# Patient Record
Sex: Male | Born: 1955 | Race: White | Hispanic: No | Marital: Single | Smoking: Current every day smoker
Health system: Southern US, Community
[De-identification: ages and names within clinical notes are randomized; demographics above are authoritative.]

## PROBLEM LIST (undated history)

## (undated) DIAGNOSIS — J449 Chronic obstructive pulmonary disease, unspecified: Secondary | ICD-10-CM

## (undated) DIAGNOSIS — M1990 Unspecified osteoarthritis, unspecified site: Secondary | ICD-10-CM

## (undated) DIAGNOSIS — M109 Gout, unspecified: Secondary | ICD-10-CM

## (undated) DIAGNOSIS — C801 Malignant (primary) neoplasm, unspecified: Secondary | ICD-10-CM

## (undated) HISTORY — PX: COLONOSCOPY: SHX174

---

## 1988-12-24 HISTORY — PX: KNEE SURGERY: SHX244

## 2012-07-29 ENCOUNTER — Ambulatory Visit: Payer: Self-pay | Admitting: Internal Medicine

## 2012-07-29 ENCOUNTER — Ambulatory Visit: Payer: Self-pay

## 2012-07-29 VITALS — BP 131/69 | HR 79 | Temp 97.6°F | Resp 16 | Ht 70.0 in | Wt 168.0 lb

## 2012-07-29 DIAGNOSIS — M25569 Pain in unspecified knee: Secondary | ICD-10-CM

## 2012-07-29 DIAGNOSIS — M25562 Pain in left knee: Secondary | ICD-10-CM

## 2012-07-29 DIAGNOSIS — M109 Gout, unspecified: Secondary | ICD-10-CM

## 2012-07-29 LAB — URIC ACID: Uric Acid, Serum: 7.2 mg/dL (ref 4.0–7.8)

## 2012-07-29 LAB — COMPREHENSIVE METABOLIC PANEL
ALT: 21 U/L (ref 0–53)
CO2: 27 mEq/L (ref 19–32)
Calcium: 9.1 mg/dL (ref 8.4–10.5)
Chloride: 102 mEq/L (ref 96–112)
Creat: 0.72 mg/dL (ref 0.50–1.35)
Glucose, Bld: 115 mg/dL — ABNORMAL HIGH (ref 70–99)
Sodium: 139 mEq/L (ref 135–145)
Total Protein: 6.8 g/dL (ref 6.0–8.3)

## 2012-07-29 LAB — POCT CBC
HCT, POC: 55 % — AB (ref 43.5–53.7)
Hemoglobin: 17.7 g/dL (ref 14.1–18.1)
MCH, POC: 33.8 pg — AB (ref 27–31.2)
MPV: 7.7 fL (ref 0–99.8)
POC MID %: 5.3 %M (ref 0–12)
RBC: 5.23 M/uL (ref 4.69–6.13)
WBC: 9.1 10*3/uL (ref 4.6–10.2)

## 2012-07-29 MED ORDER — INDOMETHACIN 50 MG PO CAPS: 50.0000 mg | ORAL_CAPSULE | Freq: Three times a day (TID) | ORAL | Status: AC

## 2012-07-29 MED ORDER — HYDROCODONE-ACETAMINOPHEN 5-500 MG PO TABS: 1.0000 | ORAL_TABLET | Freq: Three times a day (TID) | ORAL | Status: AC | PRN

## 2012-07-29 MED ORDER — METHYLPREDNISOLONE ACETATE 80 MG/ML IJ SUSP: 80.0000 mg | Freq: Once | INTRAMUSCULAR | Status: DC

## 2012-07-29 NOTE — Progress Notes (Signed)
  Subjective:    Patient ID: Edwin Hudson, male    DOB: [birth year], 56 y.o.   MRN: [id number]  HPI Has gout attack with swollen left knee. Has orthopedist that drains it but not available today. Needs knee aspirated and meds for gout attack. No fever, nothing out of the ordinary.    Review of Systems No oyher issues    Objective:   Physical Exam Left knee swollen, mildly warm, red, very painful COR ok  Aspiration knee 80cc yellow, mild cloudy fluid C and S joint fluid done Results for orders placed in visit on 07/29/12  POCT CBC      Component Value Range   WBC 9.1  4.6 - 10.2 K/uL   Lymph, poc 1.0  0.6 - 3.4   POC LYMPH PERCENT 10.6  10 - 50 %L   MID (cbc) 0.5  0 - 0.9   POC MID % 5.3  0 - 12 %M   POC Granulocyte 7.7 (*) 2 - 6.9   Granulocyte percent 84.1 (*) 37 - 80 %G   RBC 5.23  4.69 - 6.13 M/uL   Hemoglobin 17.7  14.1 - 18.1 g/dL   HCT, POC 40.9 (*) 81.1 - 53.7 %   MCV 105.2 (*) 80 - 97 fL   MCH, POC 33.8 (*) 27 - 31.2 pg   MCHC 32.2  31.8 - 35.4 g/dL   RDW, POC 91.4     Platelet Count, POC 210  142 - 424 K/uL   MPV 7.7  0 - 99.8 fL  Depomedrol 80mg  injected  [facility] reading (PRIMARY) by  Dr.Maccoy Haubner has acute knee effusion with probable gout, obvious DJD knee jt.       Assessment & Plan:  Rest,ice,wound care HC and indocin given

## 2016-02-28 ENCOUNTER — Other Ambulatory Visit: Payer: Self-pay

## 2016-02-28 DIAGNOSIS — M79609 Pain in unspecified limb: Secondary | ICD-10-CM

## 2016-02-28 DIAGNOSIS — R0989 Other specified symptoms and signs involving the circulatory and respiratory systems: Secondary | ICD-10-CM

## 2016-03-23 ENCOUNTER — Encounter: Payer: Self-pay | Admitting: Vascular Surgery

## 2016-03-30 ENCOUNTER — Ambulatory Visit (INDEPENDENT_AMBULATORY_CARE_PROVIDER_SITE_OTHER): Payer: Medicaid Other | Admitting: Vascular Surgery

## 2016-03-30 ENCOUNTER — Encounter: Payer: Self-pay | Admitting: Vascular Surgery

## 2016-03-30 ENCOUNTER — Ambulatory Visit (HOSPITAL_COMMUNITY)
Admission: RE | Admit: 2016-03-30 | Discharge: 2016-03-30 | Disposition: A | Discharge status: Home / Self Care | Payer: Medicaid Other | Source: Ambulatory Visit | Attending: Vascular Surgery | Admitting: Vascular Surgery

## 2016-03-30 VITALS — BP 115/73 | HR 58 | Ht 70.0 in | Wt 151.8 lb

## 2016-03-30 DIAGNOSIS — M79609 Pain in unspecified limb: Secondary | ICD-10-CM | POA: Insufficient documentation

## 2016-03-30 DIAGNOSIS — R0989 Other specified symptoms and signs involving the circulatory and respiratory systems: Secondary | ICD-10-CM

## 2016-03-30 DIAGNOSIS — I739 Peripheral vascular disease, unspecified: Secondary | ICD-10-CM | POA: Diagnosis not present

## 2016-03-30 MED ORDER — VARENICLINE TARTRATE 0.5 MG PO TABS: 0.5000 mg | ORAL_TABLET | Freq: Two times a day (BID) | ORAL | Status: DC

## 2016-03-30 NOTE — Progress Notes (Signed)
Referring Physician: Joni Fears M.D. Patient name: Edwin Hudson MRN: [id number] DOB: Dec 21, 1956 Sex: male  REASON FOR CONSULT: Abnormal pulse exam left foot  HPI: Edwin Hudson is a 60 y.o. male referred by Dr. Durward Fortes for possible peripheral arterial disease. The patient is being considered for a left total knee replacement. He was noted to have an abnormal pulse exam on Dr. Rudene Anda initial evaluation. He denies any claudication symptoms. He has no history of nonhealing wounds. He has no rest pain. He has a 1 pack per day smoker. He used to smoke 2 packs a day. Other medical problems include gout arthritis and remote history of bladder cancer. This is all stable. Greater than 3 minutes today spent regarding smoking cessation counseling.  Past medical history: Gout and arthritis and bladder cancer No past surgical history on file.  No family history on file.  SOCIAL HISTORY: Social History   Social History  . Marital Status: Single    Spouse Name: N/A  . Number of Children: N/A  . Years of Education: N/A   Occupational History  . Not on file.   Social History Main Topics  . Smoking status: Current Every Day Smoker -- 1.00 packs/day for 40 years    Types: Cigarettes  . Smokeless tobacco: Not on file  . Alcohol Use: 0.0 oz/week    0 Standard drinks or equivalent per week  . Drug Use: No  . Sexual Activity: Not on file   Other Topics Concern  . Not on file   Social History Narrative    No Known Allergies  Current Outpatient Prescriptions  Medication Sig Dispense Refill  . indomethacin (INDOCIN SR) 75 MG CR capsule Take 75 mg by mouth daily with breakfast. Reported on 03/30/2016    . varenicline (CHANTIX) 0.5 MG tablet Take 1 tablet (0.5 mg total) by mouth 2 (two) times daily. 60 tablet 2   Current Facility-Administered Medications  Medication Dose Route Frequency Provider Last Rate Last Dose  . methylPREDNISolone acetate (DEPO-MEDROL) injection 80 mg   80 mg Intramuscular Once Orma Flaming, MD        ROS:   General:  No weight loss, Fever, chills  HEENT: No recent headaches, no nasal bleeding, no visual changes, no sore throat  Neurologic: No dizziness, blackouts, seizures. No recent symptoms of stroke or mini- stroke. No recent episodes of slurred speech, or temporary blindness.  Cardiac: No recent episodes of chest pain/pressure, no shortness of breath at rest.  No shortness of breath with exertion.  Denies history of atrial fibrillation or irregular heartbeat  Vascular: No history of rest pain in feet.  No history of claudication.  No history of non-healing ulcer, No history of DVT   Pulmonary: No home oxygen, no productive cough, no hemoptysis,  No asthma or wheezing  Musculoskeletal:  [x ] Arthritis, [ ]  Low back pain,  [x ] Joint pain  Hematologic:No history of hypercoagulable state.  No history of easy bleeding.  No history of anemia  Gastrointestinal: No hematochezia or melena,  No gastroesophageal reflux, no trouble swallowing  Urinary: [ ]  chronic Kidney disease, [ ]  on HD - [ ]  MWF or [ ]  TTHS, [ ]  Burning with urination, [ ]  Frequent urination, [ ]  Difficulty urinating;   Skin: No rashes  Psychological: No history of anxiety,  No history of depression   Physical Examination  Filed Vitals:   03/30/16 1012  BP: 115/73  Pulse: 58  Height: 5\' 10"  (1.778  m)  Weight: 151 lb 12.8 oz (68.856 kg)  SpO2: 100%    Body mass index is 21.78 kg/(m^2).  General:  Alert and oriented, no acute distress HEENT: Normal Neck: No bruit or JVD Pulmonary: Clear to auscultation bilaterally Cardiac: Regular Rate and Rhythm without murmur Abdomen: Soft, non-tender, non-distended, no mass Skin: No rash Extremity Pulses:  2+ radial, brachial, femoral, Absent bilateral dorsalis pedis, 2+ posterior tibial pulses bilaterally Musculoskeletal: Enlarged left knee and degenerative changes bilateral hands with some warmth and effusion in  the left knee no erythema  Neurologic: Upper and lower extremity motor 5/5 and symmetric  DATA:  Patient had bilateral ABIs performed today. These were greater than 1 triphasic bilaterally. His was a normal study.  ASSESSMENT:  Patient with risk factors of atherosclerotic occlusive disease but currently asymptomatic with normal arterial non-invasive exam and essentially normal physical exam. I discussed with the patient today smoking cessation. At this point he is willing to try Chantix. I did discuss with him that one of the possible side effects from Chantix would be some bizarre dreams or mood swings. He understands that if he experiences any of these he will stop the medication.  PLAN:  Prescription for Chantix 1 month supply with 2 refills. Patient will follow-up with me on an as-needed basis. He has adequate arterial supply to heal a left total knee replacement.   Ruta Hinds, MD [facility] Office: [phone] Pager: [phone]

## 2016-09-12 ENCOUNTER — Encounter (INDEPENDENT_AMBULATORY_CARE_PROVIDER_SITE_OTHER): Payer: Self-pay

## 2016-09-13 ENCOUNTER — Encounter (INDEPENDENT_AMBULATORY_CARE_PROVIDER_SITE_OTHER): Payer: Self-pay

## 2016-09-18 ENCOUNTER — Ambulatory Visit (HOSPITAL_COMMUNITY)
Admission: RE | Admit: 2016-09-18 | Discharge: 2016-09-18 | Disposition: A | Discharge status: Home / Self Care | Payer: Medicaid Other | Source: Ambulatory Visit | Attending: Orthopedic Surgery | Admitting: Orthopedic Surgery

## 2016-09-18 ENCOUNTER — Encounter (HOSPITAL_COMMUNITY)
Admission: RE | Admit: 2016-09-18 | Discharge: 2016-09-18 | Disposition: A | Discharge status: Home / Self Care | Payer: Medicaid Other | Source: Ambulatory Visit | Attending: Orthopaedic Surgery | Admitting: Orthopaedic Surgery

## 2016-09-18 ENCOUNTER — Encounter (HOSPITAL_COMMUNITY): Payer: Self-pay

## 2016-09-18 DIAGNOSIS — Z01818 Encounter for other preprocedural examination: Secondary | ICD-10-CM

## 2016-09-18 DIAGNOSIS — R918 Other nonspecific abnormal finding of lung field: Secondary | ICD-10-CM | POA: Insufficient documentation

## 2016-09-18 DIAGNOSIS — R001 Bradycardia, unspecified: Secondary | ICD-10-CM | POA: Diagnosis not present

## 2016-09-18 HISTORY — DX: Gout, unspecified: M10.9

## 2016-09-18 HISTORY — DX: Malignant (primary) neoplasm, unspecified: C80.1

## 2016-09-18 LAB — URINALYSIS, ROUTINE W REFLEX MICROSCOPIC
Bilirubin Urine: NEGATIVE
GLUCOSE, UA: NEGATIVE mg/dL
HGB URINE DIPSTICK: NEGATIVE
KETONES UR: NEGATIVE mg/dL
LEUKOCYTES UA: NEGATIVE
Nitrite: NEGATIVE
PROTEIN: NEGATIVE mg/dL
Specific Gravity, Urine: 1.012 (ref 1.005–1.030)
pH: 6.5 (ref 5.0–8.0)

## 2016-09-18 LAB — COMPREHENSIVE METABOLIC PANEL
ALBUMIN: 4.2 g/dL (ref 3.5–5.0)
ALT: 11 U/L — ABNORMAL LOW (ref 17–63)
ANION GAP: 7 (ref 5–15)
AST: 17 U/L (ref 15–41)
Alkaline Phosphatase: 66 U/L (ref 38–126)
BUN: 6 mg/dL (ref 6–20)
CALCIUM: 9.5 mg/dL (ref 8.9–10.3)
CO2: 27 mmol/L (ref 22–32)
Chloride: 104 mmol/L (ref 101–111)
Creatinine, Ser: 0.75 mg/dL (ref 0.61–1.24)
GFR calc non Af Amer: >60 mL/min (ref >60)
GLUCOSE: 114 mg/dL — AB (ref 65–99)
POTASSIUM: 4 mmol/L (ref 3.5–5.1)
SODIUM: 138 mmol/L (ref 135–145)
TOTAL PROTEIN: 7.2 g/dL (ref 6.5–8.1)
Total Bilirubin: 0.6 mg/dL (ref 0.3–1.2)

## 2016-09-18 LAB — TYPE AND SCREEN
ABO/RH(D): A POS
Antibody Screen: NEGATIVE

## 2016-09-18 LAB — CBC WITH DIFFERENTIAL/PLATELET
BASOS PCT: 1 %
Basophils Absolute: 0 10*3/uL (ref 0.0–0.1)
EOS ABS: 0.1 10*3/uL (ref 0.0–0.7)
EOS PCT: 1 %
HCT: 46.7 % (ref 39.0–52.0)
Hemoglobin: 15.7 g/dL (ref 13.0–17.0)
LYMPHS ABS: 1.3 10*3/uL (ref 0.7–4.0)
Lymphocytes Relative: 16 %
MCH: 34.6 pg — AB (ref 26.0–34.0)
MCHC: 33.6 g/dL (ref 30.0–36.0)
MCV: 102.9 fL — ABNORMAL HIGH (ref 78.0–100.0)
MONO ABS: 0.4 10*3/uL (ref 0.1–1.0)
MONOS PCT: 5 %
NEUTROS PCT: 77 %
Neutro Abs: 6.6 10*3/uL (ref 1.7–7.7)
PLATELETS: 211 10*3/uL (ref 150–400)
RBC: 4.54 MIL/uL (ref 4.22–5.81)
RDW: 14.4 % (ref 11.5–15.5)
WBC: 8.5 10*3/uL (ref 4.0–10.5)

## 2016-09-18 LAB — APTT: aPTT: 35 seconds (ref 24–36)

## 2016-09-18 LAB — PROTIME-INR
INR: 1.01
Prothrombin Time: 13.3 seconds (ref 11.4–15.2)

## 2016-09-18 LAB — ABO/RH: ABO/RH(D): A POS

## 2016-09-18 LAB — SURGICAL PCR SCREEN
MRSA, PCR: NEGATIVE
Staphylococcus aureus: NEGATIVE

## 2016-09-18 NOTE — Pre-Procedure Instructions (Addendum)
IMAD KRIS  09/18/2016      [id number] - [place], [place] - [address] [place] [address] Phone: [phone] Fax: [phone]  [place], [place] [address] Phone: [phone] Fax: [phone]    Your procedure is scheduled on October 3  Report to [facility] at [place].M.  Call this number if you have problems the morning of surgery:  [phone]   Remember:  Do not eat food or drink liquids after midnight.   Take these medicines the morning of surgery with A SIP OF WATER NONE  7 days prior to surgery STOP taking any Aspirin, Aleve, Naproxen, Ibuprofen, Motrin, Advil, Goody's, BC's, all herbal medications, fish oil, and all vitamins   Do not wear jewelry.  Do not wear lotions, powders, or cologne, or deoderant.  Men may shave face and neck.  Do not bring valuables to the hospital.  [facility] is not responsible for any belongings or valuables.  Contacts, dentures or bridgework may not be worn into surgery.  Leave your suitcase in the car.  After surgery it may be brought to your room.  For patients admitted to the hospital, discharge time will be determined by your treatment team.  Patients discharged the day of surgery will not be allowed to drive home.    Special instructions:   [facility]- Preparing For Surgery  Before surgery, you can play an important role. Because skin is not sterile, your skin needs to be as free of germs as possible. You can reduce the number of germs on your skin by washing with CHG (chlorahexidine gluconate) Soap before surgery.  CHG is an antiseptic cleaner which kills germs and bonds with the skin to continue killing germs even after washing.  Please do not use if you have an allergy to CHG or antibacterial soaps. If your skin becomes reddened/irritated stop using the CHG.  Do not shave (including legs and  underarms) for at least 48 hours prior to first CHG shower. It is OK to shave your face.  Please follow these instructions carefully.   1. Shower the NIGHT BEFORE SURGERY and the MORNING OF SURGERY with CHG.   2. If you chose to wash your hair, wash your hair first as usual with your normal shampoo.  3. After you shampoo, rinse your hair and body thoroughly to remove the shampoo.  4. Use CHG as you would any other liquid soap. You can apply CHG directly to the skin and wash gently with a scrungie or a clean washcloth.   5. Apply the CHG Soap to your body ONLY FROM THE NECK DOWN.  Do not use on open wounds or open sores. Avoid contact with your eyes, ears, mouth and genitals (private parts). Wash genitals (private parts) with your normal soap.  6. Wash thoroughly, paying special attention to the area where your surgery will be performed.  7. Thoroughly rinse your body with warm water from the neck down.  8. DO NOT shower/wash with your normal soap after using and rinsing off the CHG Soap.  9. Pat yourself dry with a CLEAN TOWEL.   10. Wear CLEAN PAJAMAS   11. Place CLEAN SHEETS on your bed the night of your first shower and DO NOT SLEEP WITH PETS.    Day of Surgery: Do not apply any deodorants/lotions. Please wear clean clothes to the hospital/surgery center.  Please read over the following fact sheets that you were given. Pain Booklet, Coughing and Deep Breathing, MRSA Information and Surgical Site Infection Prevention

## 2016-09-18 NOTE — Progress Notes (Signed)
PCP - Katheran Awe, PA-c Cardiologist - Revankar  Chest x-ray - 09/18/16 EKG - 09/18/16 Stress Test - 02/18/16 ECHO - 02/18/16 Cardiac Cath - denies  In home sleep study PCP send patient requesting records    Patient denies shortness of breath, fever, cough and chest pain at PAT appointment

## 2016-09-18 NOTE — H&P (Addendum)
CHIEF COMPLAINT:  Painful left knee.   HISTORY OF PRESENT ILLNESS:  Edwin Hudson is a very pleasant, 60 year old, white male who is seen today for evaluation of his left knee.  I initially saw him back in February 2017 and at that time he had at least a 54-month history of pain in the left knee with a feeling almost as if it wanted to collapse on him.  However, on January 07, 2016, he was in the yard and felt as if it was going to collapse and did have a problem where he started having pain and discomfort and was unable to fully extend the knee.  This was an acute onset.  It was not really traumatic at all.  He was just walking in the yard.  He had at that time a dull aching pain, but he was finally able to extend the knee.  He did have an arthroscopy back in 2008 by [facility] for the left knee and we are not really sure exactly what was done, but at least a meniscectomy.  He did have problems with weightbearing and could not extend the knee at that time.  A corticosteroid injection was instilled at that time.  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  He followed back and he was losing more motion in March.  However, he really did not follow back up anymore for his knee, but then noted his symptoms were worsening.  We had already talked about a total knee replacement as a definitive procedure.  He continued to have pain and discomfort and finally decided to consider re-evaluation and consideration for a left total knee arthroplasty.  He has tried anti-inflammatories, corticosteroid injection and he has tried a home exercise program and these have failed.  He has pain pretty much now with every step and nighttime pain.  He is limited with his activities of daily living secondary to this pain.  He is seen today for evaluation.   PAST MEDICAL HISTORY:  In general, his health is good.   PAST SURGICAL  HISTORY/HOSPITALIZATIONS:  In 2008, a left knee arthroscopic debridement; 2010 bladder cancer excised by transurethral resection; 1990 for right knee lateral posterior meniscectomy.   MEDICATIONS:  Colchicine 0.6 mg p.r.n. for gout.   ALLERGIES:  NONE KNOWN.   REVIEW OF SYSTEMS:  A 14-point review of systems is positive for tinnitus in both ears for the past year.  He states it is slight.  He has upper and lower dentures.  He does have a morning cough, consistent with the cigarette smoking that he does. He does have a history of gout and is treated with colchicine.  He has nocturia x1. He has lost 20 pounds over the past year because of appetite loss.   FAMILY HISTORY:  Positive for a mother who is 61 years of age and has Alzheimer's.  Father is 29 and healthy.  One brother is 31 years old and healthy.  He has 2 sisters, aged 81 and 39 and are healthy.   SOCIAL HISTORY:  Edwin Hudson is a 60 year old, white, divorced, male.  A carpenter.  He smokes 2 packs of cigarettes per day and smoked for 20 years.  He is down to less than a pack, that of about 15 cigarettes per day.  He drinks alcohol on the weekends and about 2 drinks a day.   PHYSICAL EXAMINATION:   General:  A 60 year old, white male.  Well developed, well nourished, alert, cooperative and in moderate distress secondary to left knee pain.  Height 5 feet 8 inches.  Weight 150 pounds.  BMI is 22.8.  Head is normocephalic.  Eyes:  Pupils equal, round and reactive to light and accommodation with extraocular movements are intact.  Ears, nose and throat were benign. Neck was supple.  No carotid bruits noted. Chest had good expansion. Lungs were essentially clear.  There is decreased breath sounds and an occasional wheeze. Cardiac:  He had a regular rhythm and rate.  Distant heart sounds.  Normal S1, S2.  No murmurs appreciated.  Pulses were 2+ bilaterally and symmetric in the lower extremities. Abdomen is scaphoid, soft and nontender.  No masses  palpable.  Normal bowel sounds present. Genital, rectal and breast exam not indicated for an orthopedic evaluation. Musculoskeletal:  He has range of motion that lacks about 8 to 10 degrees and flexes to about 115.  He does have a mild effusion.  No warmth or erythema.  He is neurovascularly intact distally.   RADIOGRAPHS:  X-rays reveal what appears to be avascular necrosis of the distal femur with marked sclerosis and modeling noted throughout the femur and proximal tibia.  Cystic changes in the intercondylar notch also noted.  He has marked patellofemoral arthritis and joint space narrowing with periarticular spurring.  Probable loose body posteriorly.  He does have marked OA in the lateral position of the left patella with bone-on-bone.   CLINICAL IMPRESSION:   1.  End-stage OA, left knee. 2.  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 3.  Cigarette smoker. 4.  History of bladder cancer with excision. 5.  History of gout.   RECOMMENDATIONS:  At this time he has had medical clearance noted.  Cardiac also had clearance, according to documentation I have reviewed today.  Vascular has also evaluated the patient and felt that he does have bilateral ABIs that were greater than 1 triphasic bilaterally, which was basically a normal study.  At this time we will proceed with a left total knee arthroplasty.  Procedure, risks and benefits were fully explained to him and he is understanding.  I have used appropriate models to explain everything to him.  All questions were answered in detail   Mike Craze. [place], [place] [phone]  09/25/2016 7:09 AM

## 2016-09-19 LAB — URINE CULTURE: Culture: NO GROWTH

## 2016-09-24 MED ORDER — TRANEXAMIC ACID 1000 MG/10ML IV SOLN
2000.0000 mg | INTRAVENOUS | Status: DC
  Filled 2016-09-24: qty 20

## 2016-09-25 ENCOUNTER — Inpatient Hospital Stay (HOSPITAL_COMMUNITY)
Admission: RE | Admit: 2016-09-25 | Discharge: 2016-09-27 | DRG: 470 | Disposition: A | Discharge status: Home / Self Care | Payer: Medicaid Other | Source: Ambulatory Visit | Attending: Orthopaedic Surgery | Admitting: Orthopaedic Surgery

## 2016-09-25 ENCOUNTER — Inpatient Hospital Stay (HOSPITAL_COMMUNITY): Payer: Medicaid Other | Admitting: Anesthesiology

## 2016-09-25 ENCOUNTER — Encounter (HOSPITAL_COMMUNITY)
Admission: RE | Disposition: A | Discharge status: Home / Self Care | Payer: Self-pay | Source: Ambulatory Visit | Attending: Orthopaedic Surgery

## 2016-09-25 ENCOUNTER — Encounter (HOSPITAL_COMMUNITY): Payer: Self-pay | Admitting: *Deleted

## 2016-09-25 DIAGNOSIS — M109 Gout, unspecified: Secondary | ICD-10-CM | POA: Diagnosis present

## 2016-09-25 DIAGNOSIS — M1712 Unilateral primary osteoarthritis, left knee: Secondary | ICD-10-CM | POA: Diagnosis not present

## 2016-09-25 DIAGNOSIS — Z9889 Other specified postprocedural states: Secondary | ICD-10-CM | POA: Diagnosis present

## 2016-09-25 DIAGNOSIS — Z906 Acquired absence of other parts of urinary tract: Secondary | ICD-10-CM | POA: Diagnosis not present

## 2016-09-25 DIAGNOSIS — Z820 Family history of epilepsy and other diseases of the nervous system: Secondary | ICD-10-CM | POA: Diagnosis not present

## 2016-09-25 DIAGNOSIS — Z96652 Presence of left artificial knee joint: Secondary | ICD-10-CM

## 2016-09-25 DIAGNOSIS — Z8551 Personal history of malignant neoplasm of bladder: Secondary | ICD-10-CM | POA: Diagnosis not present

## 2016-09-25 DIAGNOSIS — M659 Synovitis and tenosynovitis, unspecified: Secondary | ICD-10-CM | POA: Diagnosis present

## 2016-09-25 DIAGNOSIS — F17210 Nicotine dependence, cigarettes, uncomplicated: Secondary | ICD-10-CM | POA: Diagnosis present

## 2016-09-25 HISTORY — PX: TOTAL KNEE ARTHROPLASTY: SHX125

## 2016-09-25 SURGERY — ARTHROPLASTY, KNEE, TOTAL
Anesthesia: Monitor Anesthesia Care | Site: Knee | Laterality: Left

## 2016-09-25 MED ORDER — CEFAZOLIN SODIUM-DEXTROSE 2-4 GM/100ML-% IV SOLN
2.0000 g | INTRAVENOUS | Status: AC
  Administered 2016-09-25: 2 g via INTRAVENOUS
  Filled 2016-09-25: qty 100

## 2016-09-25 MED ORDER — FENTANYL CITRATE (PF) 100 MCG/2ML IJ SOLN
INTRAMUSCULAR | Status: DC | PRN
  Administered 2016-09-25: 25 ug via INTRAVENOUS
  Administered 2016-09-25 (×2): 50 ug via INTRAVENOUS
  Administered 2016-09-25 (×3): 25 ug via INTRAVENOUS
  Administered 2016-09-25: 50 ug via INTRAVENOUS
  Administered 2016-09-25 (×2): 25 ug via INTRAVENOUS

## 2016-09-25 MED ORDER — VITAMIN B-12 1000 MCG PO TABS
2000.0000 ug | ORAL_TABLET | ORAL | Status: DC
  Administered 2016-09-27: 2000 ug via ORAL
  Filled 2016-09-25 (×2): qty 2

## 2016-09-25 MED ORDER — COLCHICINE 0.6 MG PO TABS: 0.6000 mg | ORAL_TABLET | Freq: Every day | ORAL | Status: DC | PRN

## 2016-09-25 MED ORDER — 0.9 % SODIUM CHLORIDE (POUR BTL) OPTIME
TOPICAL | Status: DC | PRN
  Administered 2016-09-25: 1000 mL

## 2016-09-25 MED ORDER — FENTANYL CITRATE (PF) 100 MCG/2ML IJ SOLN
25.0000 ug | INTRAMUSCULAR | Status: AC | PRN
  Administered 2016-09-25 (×6): 25 ug via INTRAVENOUS

## 2016-09-25 MED ORDER — EPHEDRINE 5 MG/ML INJ
INTRAVENOUS | Status: AC
  Filled 2016-09-25: qty 10

## 2016-09-25 MED ORDER — SODIUM CHLORIDE 0.9 % IV SOLN: INTRAVENOUS | Status: DC

## 2016-09-25 MED ORDER — EPHEDRINE SULFATE 50 MG/ML IJ SOLN
INTRAMUSCULAR | Status: DC | PRN
  Administered 2016-09-25: 5 mg via INTRAVENOUS
  Administered 2016-09-25 (×4): 10 mg via INTRAVENOUS
  Administered 2016-09-25: 5 mg via INTRAVENOUS

## 2016-09-25 MED ORDER — DIPHENHYDRAMINE HCL 12.5 MG/5ML PO ELIX: 12.5000 mg | ORAL_SOLUTION | ORAL | Status: DC | PRN

## 2016-09-25 MED ORDER — MIDAZOLAM HCL 2 MG/2ML IJ SOLN
1.0000 mg | Freq: Once | INTRAMUSCULAR | Status: AC
  Administered 2016-09-25: 1 mg via INTRAVENOUS

## 2016-09-25 MED ORDER — BUPIVACAINE HCL (PF) 0.25 % IJ SOLN
INTRAMUSCULAR | Status: AC
  Filled 2016-09-25: qty 30

## 2016-09-25 MED ORDER — FENTANYL CITRATE (PF) 100 MCG/2ML IJ SOLN
INTRAMUSCULAR | Status: AC
  Filled 2016-09-25: qty 2

## 2016-09-25 MED ORDER — HYDROMORPHONE HCL 1 MG/ML IJ SOLN
0.5000 mg | INTRAMUSCULAR | Status: DC | PRN
  Administered 2016-09-25 – 2016-09-26 (×2): 1 mg via INTRAVENOUS
  Filled 2016-09-25: qty 1

## 2016-09-25 MED ORDER — OXYCODONE HCL 5 MG/5ML PO SOLN: 5.0000 mg | Freq: Once | ORAL | Status: AC | PRN

## 2016-09-25 MED ORDER — TRANEXAMIC ACID 1000 MG/10ML IV SOLN
INTRAVENOUS | Status: DC | PRN
  Administered 2016-09-25: 2000 mg via TOPICAL

## 2016-09-25 MED ORDER — ACETAMINOPHEN 10 MG/ML IV SOLN
1000.0000 mg | Freq: Four times a day (QID) | INTRAVENOUS | Status: AC
  Administered 2016-09-25 – 2016-09-26 (×4): 1000 mg via INTRAVENOUS
  Filled 2016-09-25 (×4): qty 100

## 2016-09-25 MED ORDER — ONDANSETRON HCL 4 MG/2ML IJ SOLN
INTRAMUSCULAR | Status: AC
  Filled 2016-09-25: qty 2

## 2016-09-25 MED ORDER — MENTHOL 3 MG MT LOZG: 1.0000 | LOZENGE | OROMUCOSAL | Status: DC | PRN

## 2016-09-25 MED ORDER — METOCLOPRAMIDE HCL 5 MG PO TABS: 5.0000 mg | ORAL_TABLET | Freq: Three times a day (TID) | ORAL | Status: DC | PRN

## 2016-09-25 MED ORDER — METHOCARBAMOL 500 MG PO TABS
500.0000 mg | ORAL_TABLET | Freq: Four times a day (QID) | ORAL | Status: DC | PRN
  Administered 2016-09-25 – 2016-09-27 (×3): 500 mg via ORAL
  Filled 2016-09-25 (×3): qty 1

## 2016-09-25 MED ORDER — SODIUM CHLORIDE 0.9 % IR SOLN
Status: DC | PRN
  Administered 2016-09-25: 3000 mL

## 2016-09-25 MED ORDER — MAGNESIUM CITRATE PO SOLN: 1.0000 | Freq: Once | ORAL | Status: DC | PRN

## 2016-09-25 MED ORDER — HYDROMORPHONE HCL 1 MG/ML IJ SOLN
INTRAMUSCULAR | Status: AC
  Filled 2016-09-25: qty 1

## 2016-09-25 MED ORDER — ONDANSETRON HCL 4 MG/2ML IJ SOLN: 4.0000 mg | Freq: Once | INTRAMUSCULAR | Status: DC | PRN

## 2016-09-25 MED ORDER — CHLORHEXIDINE GLUCONATE 4 % EX LIQD: 60.0000 mL | Freq: Once | CUTANEOUS | Status: DC

## 2016-09-25 MED ORDER — LACTATED RINGERS IV SOLN
INTRAVENOUS | Status: DC | PRN
  Administered 2016-09-25 (×2): via INTRAVENOUS

## 2016-09-25 MED ORDER — ALUM & MAG HYDROXIDE-SIMETH 200-200-20 MG/5ML PO SUSP: 30.0000 mL | ORAL | Status: DC | PRN

## 2016-09-25 MED ORDER — RIVAROXABAN 10 MG PO TABS
10.0000 mg | ORAL_TABLET | Freq: Every day | ORAL | Status: DC
  Administered 2016-09-26 – 2016-09-27 (×2): 10 mg via ORAL
  Filled 2016-09-25 (×2): qty 1

## 2016-09-25 MED ORDER — METOCLOPRAMIDE HCL 5 MG/ML IJ SOLN: 5.0000 mg | Freq: Three times a day (TID) | INTRAMUSCULAR | Status: DC | PRN

## 2016-09-25 MED ORDER — POLYETHYLENE GLYCOL 3350 17 G PO PACK: 17.0000 g | PACK | Freq: Every day | ORAL | Status: DC | PRN

## 2016-09-25 MED ORDER — ONDANSETRON HCL 4 MG PO TABS: 4.0000 mg | ORAL_TABLET | Freq: Four times a day (QID) | ORAL | Status: DC | PRN

## 2016-09-25 MED ORDER — KETOROLAC TROMETHAMINE 15 MG/ML IJ SOLN
15.0000 mg | Freq: Four times a day (QID) | INTRAMUSCULAR | Status: AC
Start: 2016-09-25 — End: 2016-09-26
  Administered 2016-09-25 – 2016-09-26 (×3): 15 mg via INTRAVENOUS
  Filled 2016-09-25 (×3): qty 1

## 2016-09-25 MED ORDER — PHENOL 1.4 % MT LIQD: 1.0000 | OROMUCOSAL | Status: DC | PRN

## 2016-09-25 MED ORDER — ROCURONIUM BROMIDE 10 MG/ML (PF) SYRINGE
PREFILLED_SYRINGE | INTRAVENOUS | Status: AC
  Filled 2016-09-25: qty 10

## 2016-09-25 MED ORDER — MIDAZOLAM HCL 2 MG/2ML IJ SOLN
INTRAMUSCULAR | Status: AC
  Filled 2016-09-25: qty 2

## 2016-09-25 MED ORDER — ACETAMINOPHEN 10 MG/ML IV SOLN
1000.0000 mg | Freq: Four times a day (QID) | INTRAVENOUS | Status: DC
  Administered 2016-09-25: 1000 mg via INTRAVENOUS

## 2016-09-25 MED ORDER — LIDOCAINE 2% (20 MG/ML) 5 ML SYRINGE
INTRAMUSCULAR | Status: DC | PRN
  Administered 2016-09-25: 40 mg via INTRAVENOUS

## 2016-09-25 MED ORDER — LIDOCAINE 2% (20 MG/ML) 5 ML SYRINGE
INTRAMUSCULAR | Status: AC
  Filled 2016-09-25: qty 5

## 2016-09-25 MED ORDER — METHOCARBAMOL 1000 MG/10ML IJ SOLN
500.0000 mg | Freq: Four times a day (QID) | INTRAVENOUS | Status: DC | PRN
  Filled 2016-09-25: qty 5

## 2016-09-25 MED ORDER — FENTANYL CITRATE (PF) 100 MCG/2ML IJ SOLN
INTRAMUSCULAR | Status: AC
  Filled 2016-09-25: qty 4

## 2016-09-25 MED ORDER — ONDANSETRON HCL 4 MG/2ML IJ SOLN: 4.0000 mg | Freq: Four times a day (QID) | INTRAMUSCULAR | Status: DC | PRN

## 2016-09-25 MED ORDER — PROPOFOL 10 MG/ML IV BOLUS
INTRAVENOUS | Status: DC | PRN
  Administered 2016-09-25: 150 mg via INTRAVENOUS

## 2016-09-25 MED ORDER — DOCUSATE SODIUM 100 MG PO CAPS
100.0000 mg | ORAL_CAPSULE | Freq: Two times a day (BID) | ORAL | Status: DC
  Administered 2016-09-25 – 2016-09-27 (×4): 100 mg via ORAL
  Filled 2016-09-25 (×4): qty 1

## 2016-09-25 MED ORDER — MIDAZOLAM HCL 5 MG/5ML IJ SOLN
INTRAMUSCULAR | Status: DC | PRN
  Administered 2016-09-25: 2 mg via INTRAVENOUS

## 2016-09-25 MED ORDER — OXYCODONE HCL 5 MG PO TABS
ORAL_TABLET | ORAL | Status: AC
  Filled 2016-09-25: qty 1

## 2016-09-25 MED ORDER — CEFAZOLIN SODIUM-DEXTROSE 2-4 GM/100ML-% IV SOLN
2.0000 g | Freq: Four times a day (QID) | INTRAVENOUS | Status: AC
  Administered 2016-09-25 (×2): 2 g via INTRAVENOUS
  Filled 2016-09-25 (×2): qty 100

## 2016-09-25 MED ORDER — BUPIVACAINE HCL 0.25 % IJ SOLN
INTRAMUSCULAR | Status: DC | PRN
  Administered 2016-09-25: 30 mL

## 2016-09-25 MED ORDER — BISACODYL 10 MG RE SUPP: 10.0000 mg | Freq: Every day | RECTAL | Status: DC | PRN

## 2016-09-25 MED ORDER — ONDANSETRON HCL 4 MG/2ML IJ SOLN
INTRAMUSCULAR | Status: DC | PRN
  Administered 2016-09-25: 4 mg via INTRAVENOUS

## 2016-09-25 MED ORDER — ACETAMINOPHEN 10 MG/ML IV SOLN
INTRAVENOUS | Status: AC
  Filled 2016-09-25: qty 100

## 2016-09-25 MED ORDER — OXYCODONE HCL 5 MG PO TABS
5.0000 mg | ORAL_TABLET | ORAL | Status: DC | PRN
  Administered 2016-09-25 – 2016-09-27 (×11): 10 mg via ORAL
  Filled 2016-09-25 (×11): qty 2

## 2016-09-25 MED ORDER — OXYCODONE HCL 5 MG PO TABS
5.0000 mg | ORAL_TABLET | Freq: Once | ORAL | Status: AC | PRN
  Administered 2016-09-25: 5 mg via ORAL

## 2016-09-25 MED ORDER — PROPOFOL 10 MG/ML IV BOLUS
INTRAVENOUS | Status: AC
  Filled 2016-09-25: qty 20

## 2016-09-25 MED ORDER — PROPOFOL 500 MG/50ML IV EMUL
INTRAVENOUS | Status: DC | PRN
  Administered 2016-09-25: 100 ug/kg/min via INTRAVENOUS

## 2016-09-25 SURGICAL SUPPLY — 66 items
BAG DECANTER FOR FLEXI CONT (MISCELLANEOUS) ×3 IMPLANT
BANDAGE ESMARK 6X9 LF (GAUZE/BANDAGES/DRESSINGS) ×1 IMPLANT
BLADE SAGITTAL 25.0X1.19X90 (BLADE) ×2 IMPLANT
BLADE SAGITTAL 25.0X1.19X90MM (BLADE) ×1
BNDG CMPR 9X6 STRL LF SNTH (GAUZE/BANDAGES/DRESSINGS) ×1
BNDG ESMARK 6X9 LF (GAUZE/BANDAGES/DRESSINGS) ×3
BOWL SMART MIX CTS (DISPOSABLE) ×3 IMPLANT
CAP KNEE TOTAL 3 SIGMA ×2 IMPLANT
CEMENT HV SMART SET (Cement) ×6 IMPLANT
COVER SURGICAL LIGHT HANDLE (MISCELLANEOUS) ×3 IMPLANT
CUFF TOURNIQUET SINGLE 34IN LL (TOURNIQUET CUFF) ×2 IMPLANT
DECANTER SPIKE VIAL GLASS SM (MISCELLANEOUS) ×3 IMPLANT
DRAPE EXTREMITY T 121X128X90 (DRAPE) ×3 IMPLANT
DRAPE PROXIMA HALF (DRAPES) ×3 IMPLANT
DRSG ADAPTIC 3X8 NADH LF (GAUZE/BANDAGES/DRESSINGS) ×3 IMPLANT
DRSG PAD ABDOMINAL 8X10 ST (GAUZE/BANDAGES/DRESSINGS) ×6 IMPLANT
DURAPREP 26ML APPLICATOR (WOUND CARE) ×6 IMPLANT
ELECT CAUTERY BLADE 6.4 (BLADE) ×3 IMPLANT
ELECT REM PT RETURN 9FT ADLT (ELECTROSURGICAL) ×3
ELECTRODE REM PT RTRN 9FT ADLT (ELECTROSURGICAL) ×1 IMPLANT
EVACUATOR 1/8 PVC DRAIN (DRAIN) ×2 IMPLANT
FACESHIELD WRAPAROUND (MASK) ×12 IMPLANT
FACESHIELD WRAPAROUND OR TEAM (MASK) ×2 IMPLANT
GAUZE SPONGE 4X4 12PLY STRL (GAUZE/BANDAGES/DRESSINGS) ×3 IMPLANT
GLOVE BIO SURGEON STRL SZ8 (GLOVE) ×2 IMPLANT
GLOVE BIOGEL PI IND STRL 8 (GLOVE) ×1 IMPLANT
GLOVE BIOGEL PI IND STRL 8.5 (GLOVE) ×1 IMPLANT
GLOVE BIOGEL PI INDICATOR 8 (GLOVE) ×2
GLOVE BIOGEL PI INDICATOR 8.5 (GLOVE) ×2
GLOVE ECLIPSE 8.0 STRL XLNG CF (GLOVE) ×6 IMPLANT
GLOVE SURG ORTHO 8.5 STRL (GLOVE) ×6 IMPLANT
GLOVE SURG SS PI 6.5 STRL IVOR (GLOVE) ×2 IMPLANT
GLOVE SURG SS PI 7.0 STRL IVOR (GLOVE) ×2 IMPLANT
GOWN STRL REUS W/ TWL LRG LVL3 (GOWN DISPOSABLE) ×2 IMPLANT
GOWN STRL REUS W/TWL 2XL LVL3 (GOWN DISPOSABLE) ×3 IMPLANT
GOWN STRL REUS W/TWL LRG LVL3 (GOWN DISPOSABLE) ×6
HANDPIECE INTERPULSE COAX TIP (DISPOSABLE) ×3
KIT BASIN OR (CUSTOM PROCEDURE TRAY) ×3 IMPLANT
KIT ROOM TURNOVER OR (KITS) ×3 IMPLANT
MANIFOLD NEPTUNE II (INSTRUMENTS) ×3 IMPLANT
NEEDLE 22X1 1/2 (OR ONLY) (NEEDLE) ×3 IMPLANT
NS IRRIG 1000ML POUR BTL (IV SOLUTION) ×3 IMPLANT
PACK TOTAL JOINT (CUSTOM PROCEDURE TRAY) ×3 IMPLANT
PAD ABD 8X10 STRL (GAUZE/BANDAGES/DRESSINGS) ×2 IMPLANT
PAD ARMBOARD 7.5X6 YLW CONV (MISCELLANEOUS) ×6 IMPLANT
PAD CAST 4YDX4 CTTN HI CHSV (CAST SUPPLIES) ×1 IMPLANT
PADDING CAST COTTON 4X4 STRL (CAST SUPPLIES) ×3
PADDING CAST COTTON 6X4 STRL (CAST SUPPLIES) ×3 IMPLANT
SET HNDPC FAN SPRY TIP SCT (DISPOSABLE) ×1 IMPLANT
SPONGE GAUZE 4X4 12PLY STER LF (GAUZE/BANDAGES/DRESSINGS) ×2 IMPLANT
SPONGE LAP 18X18 X RAY DECT (DISPOSABLE) ×2 IMPLANT
STAPLER VISISTAT 35W (STAPLE) ×3 IMPLANT
SUCTION FRAZIER HANDLE 10FR (MISCELLANEOUS) ×2
SUCTION TUBE FRAZIER 10FR DISP (MISCELLANEOUS) ×1 IMPLANT
SURGIFLO W/THROMBIN 8M KIT (HEMOSTASIS) IMPLANT
SUT BONE WAX W31G (SUTURE) ×3 IMPLANT
SUT ETHIBOND NAB CT1 #1 30IN (SUTURE) ×6 IMPLANT
SUT MNCRL AB 3-0 PS2 18 (SUTURE) ×3 IMPLANT
SUT VIC AB 0 CT1 27 (SUTURE) ×9
SUT VIC AB 0 CT1 27XBRD ANBCTR (SUTURE) ×1 IMPLANT
SYR CONTROL 10ML LL (SYRINGE) IMPLANT
TOWEL OR 17X24 6PK STRL BLUE (TOWEL DISPOSABLE) ×3 IMPLANT
TOWEL OR 17X26 10 PK STRL BLUE (TOWEL DISPOSABLE) ×3 IMPLANT
TRAY CATH 16FR W/PLASTIC CATH (SET/KITS/TRAYS/PACK) ×2 IMPLANT
WATER STERILE IRR 1000ML POUR (IV SOLUTION) ×3 IMPLANT
WRAP KNEE MAXI GEL POST OP (GAUZE/BANDAGES/DRESSINGS) ×3 IMPLANT

## 2016-09-25 NOTE — Discharge Instructions (Signed)
Information on my medicine - XARELTO (Rivaroxaban)  This medication education was reviewed with me or my healthcare representative as part of my discharge preparation.  The pharmacist that spoke with me during my hospital stay was:  Kenney Houseman, RPH  Why was Xarelto prescribed for you? Xarelto was prescribed for you to reduce the risk of blood clots forming after orthopedic surgery. The medical term for these abnormal blood clots is venous thromboembolism (VTE).  What do you need to know about xarelto ? Take your Xarelto ONCE DAILY at the same time every day. You may take it either with or without food.  If you have difficulty swallowing the tablet whole, you may crush it and mix in applesauce just prior to taking your dose.  Take Xarelto exactly as prescribed by your doctor and DO NOT stop taking Xarelto without talking to the doctor who prescribed the medication.  Stopping without other VTE prevention medication to take the place of Xarelto may increase your risk of developing a clot.  After discharge, you should have regular check-up appointments with your healthcare provider that is prescribing your Xarelto.    What do you do if you miss a dose? If you miss a dose, take it as soon as you remember on the same day then continue your regularly scheduled once daily regimen the next day. Do not take two doses of Xarelto on the same day.   Important Safety Information A possible side effect of Xarelto is bleeding. You should call your healthcare provider right away if you experience any of the following: ? Bleeding from an injury or your nose that does not stop. ? Unusual colored urine (red or dark brown) or unusual colored stools (red or black). ? Unusual bruising for unknown reasons. ? A serious fall or if you hit your head (even if there is no bleeding).  Some medicines may interact with Xarelto and might increase your risk of bleeding while on Xarelto. To help avoid this, consult  your healthcare provider or pharmacist prior to using any new prescription or non-prescription medications, including herbals, vitamins, non-steroidal anti-inflammatory drugs (NSAIDs) and supplements.  This website has more information on Xarelto: [email or web address]

## 2016-09-25 NOTE — Anesthesia Procedure Notes (Signed)
Spinal  Patient location during procedure: OR Start time: 09/25/2016 7:15 AM End time: 09/25/2016 7:18 AM Staffing Performed: anesthesiologist  Preanesthetic Checklist Completed: patient identified, site marked, surgical consent, pre-op evaluation, timeout performed, IV checked, risks and benefits discussed and monitors and equipment checked Spinal Block Patient position: sitting Prep: Betadine Patient monitoring: heart rate, cardiac monitor and continuous pulse ox Approach: midline Location: L3-4 Injection technique: single-shot Needle Needle type: Tuohy  Needle gauge: 22 G Needle length: 9 cm Needle insertion depth: 5 cm Assessment Sensory level: T6 Additional Notes 10 mg 0.75% bupivacaine with 1:200 epi injected easily

## 2016-09-25 NOTE — Transfer of Care (Signed)
Immediate Anesthesia Transfer of Care Note  Patient: Edwin Hudson  Procedure(s) Performed: Procedure(s) with comments: TOTAL KNEE ARTHROPLASTY (Left) - block done per Dr. Linna Caprice at [phone]  Patient Location: PACU  Anesthesia Type:General, Regional and Spinal  Level of Consciousness: awake, alert  and oriented  Airway & Oxygen Therapy: Patient Spontanous Breathing  Post-op Assessment: Report given to RN, Post -op Vital signs reviewed and stable and Patient moving all extremities X 4  Post vital signs: Reviewed and stable  Last Vitals:  Vitals:   09/25/16 0557 09/25/16 0958  BP: 120/67   Pulse: (!) 58   Resp: 18   Temp: 36.7 C 36.3 C    Last Pain:  Vitals:   09/25/16 0557  TempSrc: Oral      Patients Stated Pain Goal: 3 ([id number])  Complications: No apparent anesthesia complications

## 2016-09-25 NOTE — Op Note (Signed)
PATIENT ID:      Edwin Hudson  MRN:     [id number] DOB/AGE:    06/25/1956 / 60 y.o.       OPERATIVE REPORT    DATE OF PROCEDURE:  09/25/2016       PREOPERATIVE DIAGNOSIS:END STAGE   LEFT KNEE OSTEOARTHRITIS                                                       Estimated body mass index is 22.96 kg/m as calculated from the following:   Height as of 09/18/16: 5\' 8"  (1.727 m).   Weight as of this encounter: 68.5 kg (151 lb).     POSTOPERATIVE DIAGNOSIS:END STAGE  LEFT KNEE OSTEOARTHRITIS                                                                     Estimated body mass index is 22.96 kg/m as calculated from the following:   Height as of 09/18/16: 5\' 8"  (1.727 m).   Weight as of this encounter: 68.5 kg (151 lb).     PROCEDURE:  Procedure(s):LEFT TOTAL KNEE ARTHROPLASTY      SURGEON:  Joni Fears, MD    ASSISTANT:   Biagio Borg, PA-C   (Present and scrubbed throughout the case, critical for assistance with exposure, retraction, instrumentation, and closure.)          ANESTHESIA: regional and general     DRAINS: HEMOVAC DRAIN IN LEFT KNEE CLAMPED :      TOURNIQUET TIME:  Total Tourniquet Time Documented: Thigh (Left) - 71 minutes Total: Thigh (Left) - 71 minutes     COMPLICATIONS:  None   CONDITION:  stable  PROCEDURE IN DETAIL: [id number]   Edwin Hudson 09/25/2016, 9:17 AM

## 2016-09-25 NOTE — Anesthesia Procedure Notes (Signed)
Procedure Name: LMA Insertion Date/Time: 09/25/2016 7:45 AM Performed by: Garrison Columbus T Pre-anesthesia Checklist: Patient identified, Emergency Drugs available, Suction available and Patient being monitored Patient Re-evaluated:Patient Re-evaluated prior to inductionOxygen Delivery Method: Circle System Utilized Preoxygenation: Pre-oxygenation with 100% oxygen Intubation Type: IV induction Ventilation: Mask ventilation without difficulty LMA: LMA inserted LMA Size: 5.0 Number of attempts: 1 Airway Equipment and Method: Bite block Placement Confirmation: positive ETCO2 and breath sounds checked- equal and bilateral Tube secured with: Tape Dental Injury: Teeth and Oropharynx as per pre-operative assessment

## 2016-09-25 NOTE — Anesthesia Procedure Notes (Signed)
Procedure Name: MAC Date/Time: 09/25/2016 7:25 AM Performed by: Garrison Columbus T Pre-anesthesia Checklist: Patient identified, Emergency Drugs available, Suction available and Patient being monitored Patient Re-evaluated:Patient Re-evaluated prior to inductionOxygen Delivery Method: Simple face mask Preoxygenation: Pre-oxygenation with 100% oxygen Intubation Type: IV induction Placement Confirmation: positive ETCO2 and breath sounds checked- equal and bilateral Dental Injury: Teeth and Oropharynx as per pre-operative assessment

## 2016-09-25 NOTE — Anesthesia Postprocedure Evaluation (Signed)
Anesthesia Post Note  Patient: Edwin Hudson  Procedure(s) Performed: Procedure(s) (LRB): TOTAL KNEE ARTHROPLASTY (Left)  Patient location during evaluation: PACU Anesthesia Type: General and Regional Level of consciousness: awake, awake and alert and oriented Pain management: pain level controlled Vital Signs Assessment: post-procedure vital signs reviewed and stable Respiratory status: spontaneous breathing, nonlabored ventilation and respiratory function stable Cardiovascular status: blood pressure returned to baseline Anesthetic complications: no    Last Vitals:  Vitals:   09/25/16 1212 09/25/16 1215  BP: 116/68   Pulse: (!) 52 60  Resp: 12 19  Temp:      Last Pain:  Vitals:   09/25/16 1200  TempSrc:   PainSc: 8                  Mylan Schwarz COKER

## 2016-09-25 NOTE — H&P (Signed)
The recent History & Physical has been reviewed. I have personally examined the patient today. There is no interval change to the documented History & Physical. The patient would like to proceed with the procedure.  Garald Balding 09/25/2016,  7:11 AM

## 2016-09-25 NOTE — Evaluation (Signed)
Physical Therapy Evaluation Patient Details Name: Edwin Hudson MRN: [id number] DOB: 1956/01/25 Today's Date: 09/25/2016   History of Present Illness  60 y.o. male admitted to [facility] on 09/25/16 for elective L TKA.  Pt with significant PMHx of gout, and bladder CA.  Clinical Impression  Pt is min assist to ambulate short, in-room distances with RW this evening.  His preference is crutches, so PT will try crutches for gait next session.   PT to follow acutely for deficits listed below.       Follow Up Recommendations Home health PT;Supervision for mobility/OOB    Equipment Recommendations  None recommended by PT    Recommendations for Other Services   NA    Precautions / Restrictions Precautions Precautions: Knee Precaution Booklet Issued: Yes (comment) Precaution Comments: knee exercise handout given and reviewed Restrictions Weight Bearing Restrictions: Yes LLE Weight Bearing: Partial weight bearing LLE Partial Weight Bearing Percentage or Pounds: 50      Mobility  Bed Mobility Overal bed mobility: Needs Assistance Bed Mobility: Supine to Sit     Supine to sit: Min assist     General bed mobility comments: min assist to help progress left leg over EOB.   Transfers Overall transfer level: Needs assistance Equipment used: Rolling walker (2 wheeled) Transfers: Sit to/from Stand Sit to Stand: Min assist         General transfer comment: Min assist to help to steady trunk during transition to stand.   Verbal cues for safe hand placement.   Ambulation/Gait Ambulation/Gait assistance: Min assist Ambulation Distance (Feet): 10 Feet Assistive device: Rolling walker (2 wheeled) Gait Pattern/deviations: Step-to pattern;Antalgic               Balance Overall balance assessment: Needs assistance Sitting-balance support: Feet supported;No upper extremity supported Sitting balance-Leahy Scale: Good     Standing balance support: Bilateral upper extremity  supported Standing balance-Leahy Scale: Fair                               Pertinent Vitals/Pain Pain Assessment: 0-10 Pain Score: 8  Pain Location: left knee Pain Descriptors / Indicators: Aching;Burning;Grimacing;Guarding Pain Intervention(s): Limited activity within patient's tolerance;Monitored during session;Repositioned    Home Living Family/patient expects to be discharged to:: Private residence Living Arrangements: Alone Available Help at Discharge: Family;Available 24 hours/day (sister coming to stay with him for a week) Type of Home: Mobile home Home Access: Stairs to enter Entrance Stairs-Rails: None Entrance Stairs-Number of Steps: 3 Home Layout: One level Home Equipment: Walker - 2 wheels;Crutches;Bedside commode      Prior Function Level of Independence: Independent                  Extremity/Trunk Assessment   Upper Extremity Assessment: Defer to OT evaluation           Lower Extremity Assessment: LLE deficits/detail   LLE Deficits / Details: left leg with normal post op pain and weakness.  Ankle at lest 3/5, knee 2/5, hip 2+/5  Cervical / Trunk Assessment: Normal  Communication   Communication: No difficulties  Cognition Arousal/Alertness: Awake/alert Behavior During Therapy: WFL for tasks assessed/performed Overall Cognitive Status: Within Functional Limits for tasks assessed                         Exercises Total Joint Exercises Ankle Circles/Pumps: AROM;Both;20 reps   Assessment/Plan    PT Assessment Patient needs continued  PT services  PT Problem List Decreased strength;Decreased range of motion;Decreased activity tolerance;Decreased balance;Decreased mobility;Decreased knowledge of use of DME;Decreased knowledge of precautions;Pain          PT Treatment Interventions DME instruction;Gait training;Stair training;Functional mobility training;Therapeutic activities;Therapeutic exercise;Balance  training;Neuromuscular re-education;Patient/family education;Manual techniques;Modalities    PT Goals (Current goals can be found in the Care Plan section)  Acute Rehab PT Goals Patient Stated Goal: to go home at discharge PT Goal Formulation: With patient Time For Goal Achievement: 10/02/16 Potential to Achieve Goals: Good    Frequency 7X/week           End of Session Equipment Utilized During Treatment: Gait belt Activity Tolerance: Patient limited by pain Patient left: in chair;with call bell/phone within reach           Time: 1602-1628 PT Time Calculation (min) (ACUTE ONLY): 26 min   Charges:   PT Evaluation $PT Eval Moderate Complexity: 1 Procedure PT Treatments $Therapeutic Activity: 8-22 mins        Abhishek Levesque B. Clotilde Loth, PT, DPT [phone]   09/25/2016, 4:34 PM

## 2016-09-25 NOTE — Anesthesia Preprocedure Evaluation (Addendum)
Anesthesia Evaluation  Patient identified by MRN, date of birth, ID band Patient awake    Reviewed: Allergy & Precautions, NPO status , Patient's Chart, lab work & pertinent test results  Airway Mallampati: I  TM Distance: >3 FB Neck ROM: Full    Dental  (+) Edentulous Upper, Edentulous Lower, Dental Advisory Given   Pulmonary Current Smoker,    breath sounds clear to auscultation       Cardiovascular  Rhythm:Regular Rate:Normal     Neuro/Psych    GI/Hepatic   Endo/Other    Renal/GU      Musculoskeletal   Abdominal   Peds  Hematology   Anesthesia Other Findings   Reproductive/Obstetrics                           Anesthesia Physical Anesthesia Plan  ASA: II  Anesthesia Plan: MAC and Spinal   Post-op Pain Management:    Induction:   Airway Management Planned: Natural Airway and Simple Face Mask  Additional Equipment:   Intra-op Plan:   Post-operative Plan:   Informed Consent: I have reviewed the patients History and Physical, chart, labs and discussed the procedure including the risks, benefits and alternatives for the proposed anesthesia with the patient or authorized representative who has indicated his/her understanding and acceptance.     Plan Discussed with: CRNA and Anesthesiologist  Anesthesia Plan Comments:         Anesthesia Quick Evaluation

## 2016-09-25 NOTE — Progress Notes (Signed)
Orthopedic Tech Progress Note Patient Details:  DAIVEN WINTERROWD 1956/06/05 [id number] Ortho visit put on cpm at [place] Patient ID: Boone Master, male   DOB: March 20, 1956, 60 y.o.   MRN: [id number]   Braulio Bosch 09/25/2016, 6:49 PM

## 2016-09-25 NOTE — Anesthesia Procedure Notes (Signed)
Anesthesia Regional Block:  Adductor canal block  Pre-Anesthetic Checklist: ,, timeout performed, Correct Patient, Correct Site, Correct Laterality, Correct Procedure, Correct Position, site marked, Risks and benefits discussed,  Surgical consent,  Pre-op evaluation,  At surgeon's request and post-op pain management  Laterality: Left  Prep: chloraprep       Needles:  Injection technique: Single-shot  Needle Type: Stimulator Needle - 80     Needle Length: 9cm 9 cm Needle Gauge: 22 and 22 G    Additional Needles:  Procedures: ultrasound guided (picture in chart) Adductor canal block Narrative:  Start time: 09/25/2016 9:45 AM End time: 09/25/2016 9:50 AM Injection made incrementally with aspirations every 5 mL.  Performed by: Personally   Additional Notes: 30 cc 0.5% Bupivacaine with 1:200 epi injected easily

## 2016-09-25 NOTE — Progress Notes (Signed)
Orthopedic Tech Progress Note Patient Details:  Edwin Hudson 06-17-1956 [id number]  CPM Left Knee CPM Left Knee: On Left Knee Flexion (Degrees): 90 Left Knee Extension (Degrees): 0 Additional Comments: Trapeze bar   [place] Pink 09/25/2016, 11:07 AM

## 2016-09-25 NOTE — Progress Notes (Signed)
Orthopedic Tech Progress Note Patient Details:  Edwin Hudson 07/30/1956 [id number]  CPM Left Knee CPM Left Knee: On Left Knee Flexion (Degrees): 90 Left Knee Extension (Degrees): 0 Additional Comments: Trapeze bar   [place] Pink 09/25/2016, 3:00 PM

## 2016-09-26 ENCOUNTER — Encounter (HOSPITAL_COMMUNITY): Payer: Self-pay | Admitting: Orthopaedic Surgery

## 2016-09-26 LAB — BASIC METABOLIC PANEL
ANION GAP: 6 (ref 5–15)
BUN: 6 mg/dL (ref 6–20)
CHLORIDE: 103 mmol/L (ref 101–111)
CO2: 27 mmol/L (ref 22–32)
Calcium: 8.5 mg/dL — ABNORMAL LOW (ref 8.9–10.3)
Creatinine, Ser: 0.73 mg/dL (ref 0.61–1.24)
Glucose, Bld: 102 mg/dL — ABNORMAL HIGH (ref 65–99)
POTASSIUM: 3.9 mmol/L (ref 3.5–5.1)
SODIUM: 136 mmol/L (ref 135–145)

## 2016-09-26 LAB — CBC
HCT: 38 % — ABNORMAL LOW (ref 39.0–52.0)
HEMOGLOBIN: 12.3 g/dL — AB (ref 13.0–17.0)
MCH: 33.5 pg (ref 26.0–34.0)
MCHC: 32.4 g/dL (ref 30.0–36.0)
MCV: 103.5 fL — ABNORMAL HIGH (ref 78.0–100.0)
PLATELETS: 168 10*3/uL (ref 150–400)
RBC: 3.67 MIL/uL — AB (ref 4.22–5.81)
RDW: 14.5 % (ref 11.5–15.5)
WBC: 5.4 10*3/uL (ref 4.0–10.5)

## 2016-09-26 MED ORDER — PNEUMOCOCCAL VAC POLYVALENT 25 MCG/0.5ML IJ INJ
0.5000 mL | INJECTION | INTRAMUSCULAR | Status: AC
  Administered 2016-09-27: 0.5 mL via INTRAMUSCULAR
  Filled 2016-09-26: qty 0.5

## 2016-09-26 NOTE — Progress Notes (Signed)
Orthopedic Tech Progress Note Patient Details:  Edwin Hudson 1956/03/30 [id number]  Patient ID: Edwin Hudson, male   DOB: [birth year], 60 y.o.   MRN: [id number]   [name] 09/26/2016, 2:08 PM Placed pt's lle on cpm @0 -60 degrees 21410; RN notified

## 2016-09-26 NOTE — Evaluation (Addendum)
Occupational Therapy Evaluation Patient Details Name: Edwin Hudson MRN: [id number] DOB: 05-03-1956 Today's Date: 09/26/2016    History of Present Illness 60 y.o. male admitted to [facility] on 09/25/16 for elective L TKA.  Pt with significant PMHx of gout, and bladder CA.   Clinical Impression   PTA, pt was independent with all basic ADL and IADL. Pt admitted and underwent the above and currently requires min guard assist for functional mobility during ADL, LB ADL, and tub transfer. Pt plans to D/C home with his sister providing 24 hour assistance/supervision. Pt would benefit from further OT services during acute care stay. No OT follow-up necessary post-acute D/C. Pt has all DME needs met. OT will continue to follow acutely.    Follow Up Recommendations  No OT follow up;Supervision/Assistance - 24 hour    Equipment Recommendations  None recommended by OT       Precautions / Restrictions Precautions Precautions: Knee Precaution Booklet Issued: No Restrictions Weight Bearing Restrictions: Yes LLE Weight Bearing: Partial weight bearing LLE Partial Weight Bearing Percentage or Pounds: 50      Mobility Bed Mobility Overal bed mobility: Needs Assistance Bed Mobility: Sit to Supine     Supine to sit: Supervision; HOB elevated Sit to supine: Supervision;HOB elevated   General bed mobility comments:   Transfers Overall transfer level: Needs assistance Equipment used: Rolling walker (2 wheeled) Transfers: Sit to/from Stand Sit to Stand: Min guard         General transfer comment: VC's for safe hand placement with RW.    Balance Overall balance assessment: Needs assistance Sitting-balance support: Feet supported;No upper extremity supported Sitting balance-Leahy Scale: Good     Standing balance support: During functional activity;Single extremity supported Standing balance-Leahy Scale: Fair                              ADL Overall ADL's : Needs  assistance/impaired Eating/Feeding: Supervision/ safety;Set up;Sitting   Grooming: Supervision/safety;Set up;Sitting   Upper Body Bathing: Supervision/ safety;Set up;Sitting   Lower Body Bathing: Min guard;Sit to/from stand   Upper Body Dressing : Supervision/safety;Set up;Sitting   Lower Body Dressing: Min guard;Sit to/from stand   Toilet Transfer: Min guard;Ambulation;RW;BSC   Toileting- Water quality scientist and Hygiene: Min guard;Sit to/from stand   Tub/ Shower Transfer: Min guard;Cueing for safety;Cueing for sequencing;Ambulation;3 in 1;Rolling walker   Functional mobility during ADLs: Min guard;Rolling walker General ADL Comments: Pt educated on adhering to [facility] precuations during ADLs, safe use of RW during tub transfer, and fall prevention.     Vision Vision Assessment?: No apparent visual deficits          Pertinent Vitals/Pain Pain Assessment: 0-10 Pain Score: 4  Pain Location: L knee Pain Descriptors / Indicators: Burning;Sore;Grimacing;Guarding Pain Intervention(s): Monitored during session;Repositioned     Hand Dominance Right   Extremity/Trunk Assessment Upper Extremity Assessment Upper Extremity Assessment: Overall WFL for tasks assessed   Lower Extremity Assessment Lower Extremity Assessment: LLE deficits/detail LLE Deficits / Details: Decreased ROM and strength as expected post-operatively.       Communication Communication Communication: No difficulties   Cognition Arousal/Alertness: Awake/alert Behavior During Therapy: WFL for tasks assessed/performed Overall Cognitive Status: Within Functional Limits for tasks assessed                                Home Living Family/patient expects to be discharged to:: Private residence Living  Arrangements: Alone Available Help at Discharge: Family;Available 24 hours/day Type of Home: Mobile home Home Access: Stairs to enter Entrance Stairs-Number of Steps: 3 Entrance Stairs-Rails:  None Home Layout: One level     Bathroom Shower/Tub: Tub/shower unit Shower/tub characteristics: Architectural technologist: Standard Bathroom Accessibility: Yes How Accessible: Accessible via walker Home Equipment: [place] - 2 wheels;Bedside commode;Grab bars - tub/shower;Crutches          Prior Functioning/Environment Level of Independence: Independent                 OT Problem List: Decreased strength;Decreased range of motion;Decreased activity tolerance;Impaired balance (sitting and/or standing);Decreased knowledge of use of DME or AE;Decreased knowledge of precautions;Decreased safety awareness;Pain   OT Treatment/Interventions: Self-care/ADL training;DME and/or AE instruction;Therapeutic activities;Balance training;Patient/family education    OT Goals(Current goals can be found in -the care plan section) Acute Rehab OT Goals Patient Stated Goal: to go home and get back to normal OT Goal Formulation: With patient Time For Goal Achievement: 10/10/16 Potential to Achieve Goals: Good ADL Goals Pt Will Perform Tub/Shower Transfer: with supervision;3 in 1;ambulating;rolling walker  OT Frequency: Min 2X/week    End of Session Equipment Utilized During Treatment: Gait belt;Rolling walker CPM Left Knee CPM Left Knee: On Nurse Communication: Other (comment) (Drain cord pulled)  Activity Tolerance: Patient tolerated treatment well Patient left: in bed;with call bell/phone within reach   Time: 1329-1405 OT Time Calculation (min): 36 min Charges:  OT General Charges $OT Visit: 1 Procedure OT Evaluation $OT Eval Moderate Complexity: 1 Procedure OT Treatments $Self Care/Home Management : 8-22 mins  Norman Herrlich, OTR/L [phone] 09/26/2016, 2:23 PM

## 2016-09-26 NOTE — Progress Notes (Signed)
Physical Therapy Treatment Patient Details Name: Edwin Hudson MRN: [id number] DOB: 11-12-1956 Today's Date: 09/26/2016    History of Present Illness 60 y.o. male admitted to [facility] on 09/25/16 for elective L TKA.  Pt with significant PMHx of gout, and bladder CA.    PT Comments    Pt is progressing well with his mobility. May try crutches this PM and see if pt prefers crutches to RW.  Exercises program initiated and reinforced knee precautions.  Pt is doing well.    Follow Up Recommendations  Home health PT;Supervision for mobility/OOB     Equipment Recommendations  None recommended by PT    Recommendations for Other Services   NA     Precautions / Restrictions Precautions Precautions: Knee Precaution Booklet Issued: Yes (comment) Precaution Comments: knee exercise handout given and reviewed Restrictions LLE Weight Bearing: Partial weight bearing LLE Partial Weight Bearing Percentage or Pounds: 50    Mobility  Bed Mobility Overal bed mobility: Needs Assistance Bed Mobility: Supine to Sit     Supine to sit: Min assist     General bed mobility comments: Min assist to help progress left leg over EOB.  HOB flat, pt using railing for leverage  Transfers Overall transfer level: Needs assistance Equipment used: Rolling walker (2 wheeled) Transfers: Sit to/from Stand Sit to Stand: Min guard         General transfer comment: Min guard assist for safety during transitions, verbal cues for safe hand placement.   Ambulation/Gait Ambulation/Gait assistance: Supervision Ambulation Distance (Feet): 75 Feet Assistive device: Rolling walker (2 wheeled) Gait Pattern/deviations: Step-through pattern;Antalgic;Trunk flexed (flexed left knee gait pattern) Gait velocity: decreased Gait velocity interpretation: Below normal speed for age/gender General Gait Details: Verbal cues for upright posture, safe RW use, and good heel to toe gait pattern.           Balance Overall  balance assessment: Needs assistance Sitting-balance support: Feet supported;No upper extremity supported Sitting balance-Leahy Scale: Good     Standing balance support: Bilateral upper extremity supported;Single extremity supported;No upper extremity supported Standing balance-Leahy Scale: Fair                      Cognition Arousal/Alertness: Awake/alert Behavior During Therapy: WFL for tasks assessed/performed Overall Cognitive Status: Within Functional Limits for tasks assessed                      Exercises Total Joint Exercises Ankle Circles/Pumps: AROM;Both;20 reps Quad Sets: AROM;Both;10 reps Towel Squeeze: AROM;Both;10 reps Heel Slides: AAROM;Left;10 reps        Pertinent Vitals/Pain Pain Assessment: 0-10 Pain Score: 5  Pain Location: left knee Pain Descriptors / Indicators: Aching;Burning Pain Intervention(s): Limited activity within patient's tolerance;Monitored during session;Repositioned           PT Goals (current goals can now be found in the care plan section) Acute Rehab PT Goals Patient Stated Goal: to go home at discharge Progress towards PT goals: Progressing toward goals    Frequency    7X/week      PT Plan Current plan remains appropriate       End of Session   Activity Tolerance: Patient limited by pain Patient left: in chair;with call bell/phone within reach     Time: 1018-1040 PT Time Calculation (min) (ACUTE ONLY): 22 min  Charges:  $Gait Training: 8-22 mins  Barbarann Ehlers Rjay Revolorio, PT, DPT [phone]   09/26/2016, 11:33 AM

## 2016-09-26 NOTE — Progress Notes (Signed)
Physical Therapy Treatment Patient Details Name: Edwin Hudson MRN: [id number] DOB: January 26, 1956 Today's Date: 09/26/2016    History of Present Illness 60 y.o. male admitted to [facility] on 09/25/16 for elective L TKA.  Pt with significant PMHx of gout, and bladder CA.    PT Comments    Pt is POD #1 and is moving well with both RW and axillary crutches.  Pt's preference is crutches and he seems safe and stable with them.  We finished reviewing his HEP and he will need stair training in the AM.  PT to follow acutely until d/c confirmed.     Follow Up Recommendations  Home health PT;Supervision for mobility/OOB     Equipment Recommendations  None recommended by PT    Recommendations for Other Services   NA     Precautions / Restrictions Precautions Precautions: Knee Precaution Booklet Issued: No Precaution Comments: knee exercise handout given and reviewed Restrictions Weight Bearing Restrictions: Yes LLE Weight Bearing: Partial weight bearing LLE Partial Weight Bearing Percentage or Pounds: 50    Mobility  Bed Mobility Overal bed mobility: Needs Assistance Bed Mobility: Supine to Sit     Supine to sit: Min assist Sit to supine: Modified independent (Device/Increase time)   General bed mobility comments: Pt needs min assist to progress left leg over EOB.  Pt was able to lift his leg back in after education to use his stronger leg to help lift it up.    Transfers Overall transfer level: Needs assistance Equipment used: Crutches Transfers: Sit to/from Stand Sit to Stand: Supervision         General transfer comment: supervision for safety   Ambulation/Gait Ambulation/Gait assistance: Min guard Ambulation Distance (Feet): 100 Feet Assistive device: Crutches Gait Pattern/deviations: Step-to pattern;Antalgic Gait velocity: decreased Gait velocity interpretation: Below normal speed for age/gender General Gait Details: Pt's preference is axillary crutches, so I got a pair  to make sure he was safe with gait with crutches and despite my increased assist, he was able to progress to supervision by the end of our walk.  He demonstrated safe and correct LE sequencing with crutches and I reinforced 50% PWB.           Balance Overall balance assessment: Needs assistance Sitting-balance support: Feet supported;No upper extremity supported Sitting balance-Leahy Scale: Good     Standing balance support: Bilateral upper extremity supported Standing balance-Leahy Scale: Fair                      Cognition Arousal/Alertness: Awake/alert Behavior During Therapy: WFL for tasks assessed/performed Overall Cognitive Status: Within Functional Limits for tasks assessed                      Exercises Total Joint Exercises Short Arc QuadSinclair Ship;Left;10 reps Straight Leg Raises: AAROM;Left;10 reps Hip abduction/adduction: AROM; Left; 10 reps Long Arc Quad: AAROM;Left;10 reps (eccentric) Knee Flexion: AROM;AAROM;Left;10 reps;Seated Goniometric ROM: 20-60 AAROM        Pertinent Vitals/Pain Pain Assessment: 0-10 Pain Score: 8  Pain Location: left knee after flexion exercises Pain Descriptors / Indicators: Aching;Burning Pain Intervention(s): Limited activity within patient's tolerance;Monitored during session;Repositioned;Ice applied;Patient requesting pain meds-RN notified           PT Goals (current goals can now be found in the care plan section) Acute Rehab PT Goals Patient Stated Goal: to go home and get back to normal Progress towards PT goals: Progressing toward goals    Frequency  7X/week      PT Plan Current plan remains appropriate       End of Session   Activity Tolerance: Patient limited by pain Patient left: in bed;with call bell/phone within reach;in CPM     Time: 1433-1459 PT Time Calculation (min) (ACUTE ONLY): 26 min  Charges:  $Gait Training: 8-22 mins $Therapeutic Exercise: 8-22 mins             Berel Najjar B. Sherley Mckenney, PT, DPT [phone]   09/26/2016, 3:09 PM

## 2016-09-26 NOTE — Op Note (Signed)
NAME:  Edwin Hudson, Edwin Hudson NO.:  [id number]  MEDICAL RECORD NO.:  [id number]  LOCATION:  [id number]                        FACILITY:  [facility]  PHYSICIAN:  Vonna Kotyk. Cai Flott, M.D.DATE OF BIRTH:  [birth year]  DATE OF PROCEDURE:  09/25/2016 DATE OF DISCHARGE:                              OPERATIVE REPORT   PREOPERATIVE DIAGNOSIS:  End-stage osteoarthritis, left knee.  POSTOPERATIVE DIAGNOSIS:  End-stage osteoarthritis, left knee.  PROCEDURE:  Left total knee replacement.  SURGEON:  Vonna Kotyk. Durward Fortes, M.D.  ASSISTANT:  Aaron Edelman D. Petrarca, P.A.-C. was present throughout the operative procedure to ensure its timely completion.  ANESTHESIA:  Failed spinal, with general with adductor canal block.  COMPLICATIONS:  None.  COMPONENTS:  DePuy LCS standard plus femoral component, #4 rotating keeled tibial tray with a 12.5 mm polyethylene bridging bearing, a metal- backed 3-pegged rotating patella.  Components were secured with polymethyl methacrylate.  DESCRIPTION OF PROCEDURE:  [name] was met in the holding area.  I identified the left knee as appropriate operative site and marked it accordingly.  Any questions were answered.  The patient was then transported to room #7.  Attempt at spinal failed and accordingly, he was placed on general laryngeal anesthesia without difficulty.  The left lower extremity was then placed in a thigh tourniquet.  The leg was prepped with chlorhexidine scrub and DuraPrep x2 from the tourniquet to the tips of the toes.  Sterile draping was performed.  Time-out was called.  Extremity was then elevated, was Esmarch exsanguinated with a proximal tourniquet at 350 mmHg.  A midline longitudinal incision was made centered about the patella, extending from the superior pouch to tibial tubercle.  Via sharp dissection, incision was carried down to subcutaneous tissue.  By Bovie dissection, the 1st layer of capsule was incised in the midline.   A medial parapatellar incision was then made with the Bovie.  The joint was entered.  There was a minimal clear yellow joint effusion.  The patella was everted 180 degrees laterally.  The knee flexed to 90 degrees.  There was abundant beefy red synovitis.  Synovectomy was performed.  There were large osteophytes along the medial and lateral femoral condyle.  Little, if any, articular cartilage remaining on either femoral condyle with eburnated bone.  There was also very minimal cartilage remaining on both medial and lateral tibial plateau.  We measured a standard plus tibial femoral components.  First, bony cut was then made transversely with a 7-degree angle of declination on the tibia using the external tibial guide.  Subsequent cuts were then made on the femur using the standard plus femoral jig. Flexion and extension gaps were perfectly symmetrical at 12.5 mm.  MCL and LCL remained intact throughout the procedure.  Final cuts were then made on the femur with a 4-degree distal femoral valgus and then the finishing guide for tapering and obtain the center hole.  Retractors were then placed around the tibia, was advanced anteriorly. Measured a #4 tibial tray.  This was pinned in place and measured and checked with the external guide.  A central hole was then made with the guide finders followed by the keeled cut.  With the tibial jig  in place, the 12.5 mm polyethylene bridging bearing was inserted as the flexion and extension gaps were symmetrical 12.5.  The trial standard plus femoral component was then applied.  The components were reduced and through full range of motion with full flexion and extension, no malrotation of the tibial components and no opening with varus or valgus stress.  The patella was prepared by removing approximately 11 mm of bone leaving 13 mm of patella thickness.  Three holes were made for the patella. Trial was then inserted, reduced and through a full  range of motion was perfectly stable.  Trial components were removed.  The joint was copiously irrigated with jet saline solution.  Final components were impacted with polymethyl methacrylate.  We initially inserted the #4 tibial tray followed by the 12.5 mm polyethylene bridging bearing and the standard plus femoral component. Extraneous methacrylate was removed from its periphery.  The patella was applied with the same methacrylate.  The components were reduced.  At approximately 16 minutes, the methacrylate had matured, during which time, we irrigated the joint and then injected 0.25% Marcaine without epinephrine.  Tourniquet was deflated at 71 minutes.  Gross bleeders were Bovie coagulated.  The joint was covered with topical tranexamic acid.  A medium-size Hemovac was placed through the lateral capsule.  We had nice hemostasis.  The deep capsule was then closed with a running 0 Ethibond, superficial capsule closed with a running 0 Vicryl and 3-0 Monocryl, and then skin clips.  Sterile bulky dressing was applied followed by the patient's support stocking.  The patient tolerated the procedure well without complication.     Vonna Kotyk. Durward Fortes, M.D.     PWW/MEDQ  D:  09/25/2016  T:  09/26/2016  Job:  [id number]

## 2016-09-26 NOTE — Progress Notes (Signed)
Orthopedic Tech Progress Note Patient Details:  [name] August 12, 1956 [id number]  Patient ID: Edwin Hudson, male   DOB: 1956/11/05, 60 y.o.   MRN: [id number] Applied cpm 0-60  Karolee Stamps 09/26/2016, 6:11 AM

## 2016-09-26 NOTE — Progress Notes (Signed)
PATIENT ID: Edwin Hudson        MRN:  [id number]          DOB/AGE: 1956-08-14 / 60 y.o.    Joni Fears, MD   Biagio Borg, PA-C [address] [place], [place]  [address]                             [phone]   PROGRESS NOTE  Subjective:  negative for Chest Pain  negative for Shortness of Breath  negative for Nausea/Vomiting   negative for Calf Pain    Tolerating Diet: yes         Patient reports pain as mild and moderate.     Doing well.  States he is very comfortable  Objective: Vital signs in last 24 hours:   Patient Vitals for the past 24 hrs:  BP Temp Temp src Pulse Resp SpO2  09/26/16 0500 98/72 97.9 F (36.6 C) Oral 66 19 98 %  09/25/16 2216 101/62 98 F (36.7 C) Oral 61 18 98 %  09/25/16 1351 107/71 - - (!) 56 15 97 %  09/25/16 1300 - 97.3 F (36.3 C) - (!) 59 12 100 %  09/25/16 1258 114/71 - - (!) 53 13 100 %  09/25/16 1245 - - - (!) 57 13 99 %  09/25/16 1243 135/72 - - (!) 55 15 99 %  09/25/16 1230 - 97.3 F (36.3 C) - (!) 35 15 100 %  09/25/16 1227 114/71 - - (!) 54 14 98 %  09/25/16 1215 - - - 60 19 98 %  09/25/16 1212 116/68 - - (!) 52 12 98 %  09/25/16 1200 - - - (!) 52 11 97 %  09/25/16 1158 109/66 - - (!) 50 11 99 %  09/25/16 1145 - - - (!) 54 12 93 %  09/25/16 1142 121/65 - - (!) 54 15 96 %  09/25/16 1130 - - - (!) 51 15 95 %  09/25/16 1127 118/78 - - (!) 56 10 96 %  09/25/16 1115 - - - (!) 56 15 98 %  09/25/16 1112 122/62 - - (!) 48 13 94 %  09/25/16 1100 - - - (!) 53 11 95 %  09/25/16 1057 113/73 - - (!) 53 (!) 9 96 %  09/25/16 1045 - - - (!) 56 15 96 %  09/25/16 1042 123/68 - - 61 19 96 %  09/25/16 1030 - - - (!) 59 12 97 %  09/25/16 1027 111/71 - - 62 12 95 %  09/25/16 1015 - - - 62 13 95 %  09/25/16 1000 - - - 73 15 97 %  09/25/16 0958 - 97.3 F (36.3 C) - - - -      Intake/Output from previous day:   10/03 0701 - 10/04 0700 In: 2100 [P.O.:600; I.V.:1500] Out: 1970 [Urine:1570; Drains:300]   Intake/Output this shift:     No intake/output data recorded.   Intake/Output      10/03 0701 - 10/04 0700 10/04 0701 - 10/05 0700   P.O. 600    I.V. (mL/kg) 1500 (21.9)    Total Intake(mL/kg) 2100 (30.7)    Urine (mL/kg/hr) 1570 (1)    Drains 300 (0.2)    Blood 100 (0.1)    Total Output 1970     Net +130             LABORATORY DATA:  Recent Labs  09/26/16  0636  WBC 5.4  HGB 12.3*  HCT 38.0*  PLT 168   No results for input(s): NA, K, CL, CO2, BUN, CREATININE, GLUCOSE, CALCIUM in the last 168 hours. Lab Results  Component Value Date   INR 1.01 09/18/2016    Recent Radiographic Studies :  Dg Chest 2 View  Result Date: 09/18/2016 CLINICAL DATA:  Left knee replacement. EXAM: CHEST  2 VIEW COMPARISON:  No recent prior . FINDINGS: Mediastinum and hilar structures normal. Mild bilateral from interstitial prominence noted. These changes may be related chronic interstitial lung disease. Active pneumonitis cannot be excluded . No pleural effusion or pneumothorax low lung volumes with mild bibasilar atelectasis. IMPRESSION: Low lung volumes with mild bibasilar atelectasis. Interstitial prominence noted bilaterally. These changes most likely related chronic interstitial lung disease. Active pneumonitis cannot be excluded. Electronically Signed   By: Marcello Moores  Register   On: 09/18/2016 12:26     Examination:  General appearance: alert, cooperative, mild distress and moderate distress Resp: clear to auscultation bilaterally Cardio: regular rate and rhythm GI: normal findings: bowel sounds normal  Wound Exam: clean, dry, intact dressing  Drainage:  None: wound tissue dry  Motor Exam: EHL, FHL, Anterior Tibial and Posterior Tibial Intact  Sensory Exam: Superficial Peroneal, Deep Peroneal and Tibial normal  Vascular Exam: Left posterior tibial artery has 1+ (weak) pulse  Assessment:    1 Day Post-Op  Procedure(s) (LRB): TOTAL KNEE ARTHROPLASTY (Left)  ADDITIONAL DIAGNOSIS:  Principal Problem:   Primary  osteoarthritis of left knee Active Problems:   S/P total knee replacement using cement, left   Plan: Physical Therapy as ordered Partial Weight Bearing @ 50% (PWB)  DVT Prophylaxis:  Xarelto, Foot Pumps and TED hose  DISCHARGE PLAN: Home  DISCHARGE NEEDS: HHPT, CPM, Walker and 3-in-1 comode seat        Oprah Camarena  09/26/2016 8:00 AM

## 2016-09-27 LAB — CBC
HEMATOCRIT: 33.4 % — AB (ref 39.0–52.0)
HEMOGLOBIN: 10.7 g/dL — AB (ref 13.0–17.0)
MCH: 32.8 pg (ref 26.0–34.0)
MCHC: 32 g/dL (ref 30.0–36.0)
MCV: 102.5 fL — AB (ref 78.0–100.0)
PLATELETS: 163 10*3/uL (ref 150–400)
RBC: 3.26 MIL/uL — AB (ref 4.22–5.81)
RDW: 14.3 % (ref 11.5–15.5)
WBC: 6.5 10*3/uL (ref 4.0–10.5)

## 2016-09-27 LAB — BASIC METABOLIC PANEL
ANION GAP: 12 (ref 5–15)
BUN: <5 mg/dL — ABNORMAL LOW (ref 6–20)
CHLORIDE: 97 mmol/L — AB (ref 101–111)
CO2: 27 mmol/L (ref 22–32)
Calcium: 8.7 mg/dL — ABNORMAL LOW (ref 8.9–10.3)
Creatinine, Ser: 0.7 mg/dL (ref 0.61–1.24)
GFR calc Af Amer: >60 mL/min (ref >60)
GLUCOSE: 102 mg/dL — AB (ref 65–99)
POTASSIUM: 4 mmol/L (ref 3.5–5.1)
Sodium: 136 mmol/L (ref 135–145)

## 2016-09-27 MED ORDER — METHOCARBAMOL 500 MG PO TABS: 500.0000 mg | ORAL_TABLET | Freq: Three times a day (TID) | ORAL | 0 refills | Status: DC | PRN

## 2016-09-27 MED ORDER — OXYCODONE-ACETAMINOPHEN 5-325 MG PO TABS
1.0000 | ORAL_TABLET | ORAL | 0 refills | Status: DC | PRN
Start: 2016-09-27 — End: 2021-02-11

## 2016-09-27 MED ORDER — RIVAROXABAN 10 MG PO TABS: 10.0000 mg | ORAL_TABLET | Freq: Every day | ORAL | 0 refills | Status: DC

## 2016-09-27 NOTE — Progress Notes (Signed)
Occupational Therapy Treatment/Discharge Patient Details Name: Edwin Hudson MRN: [id number] DOB: May 15, 1956 Today's Date: 09/27/2016    History of present illness 60 y.o. male admitted to [facility] on 09/25/16 for elective L TKA.  Pt with significant PMHx of gout, and bladder CA.   OT comments  Continued pt and family education concerning fall prevention, use of ice for pain management, safety with tub and toilet transfers, and use of AE for LB dressing after D/C home.  Pt able to complete functional mobility and ADL transfers with min guard assist this date for safety. Pt plans to D/C home today with 24 hour assistance/supervision from his sister. She was present for all education this session and she and pt demonstrate understanding. D/C plans remain appropriate and pt has all DME needs met. No OT follow-up required post-acute D/C and further acute OT needs. OT signing off.  Follow Up Recommendations  No OT follow up;Supervision/Assistance - 24 hour    Equipment Recommendations  None recommended by OT       Precautions / Restrictions Precautions Precautions: Knee Precaution Booklet Issued: No Restrictions Weight Bearing Restrictions: Yes LLE Weight Bearing: Partial weight bearing LLE Partial Weight Bearing Percentage or Pounds: 50       Mobility Bed Mobility Overal bed mobility: Needs Assistance Bed Mobility: Supine to Sit;Sit to Supine     Supine to sit: Supervision;HOB elevated Sit to supine: Supervision;HOB elevated   General bed mobility comments: Pt able to progress L leg over EOB with increased time and supervision for safety.  Transfers Overall transfer level: Needs assistance Equipment used: Crutches;Rolling walker (2 wheeled) Transfers: Sit to/from Stand Sit to Stand: Min guard         General transfer comment: for safety    Balance Overall balance assessment: Needs assistance Sitting-balance support: Feet supported;No upper extremity supported Sitting  balance-Leahy Scale: Good     Standing balance support: Single extremity supported;During functional activity Standing balance-Leahy Scale: Fair Standing balance comment: Able to remove R hand from RW to wash hands at sink.                   ADL Overall ADL's : Needs assistance/impaired     Grooming: Min guard;Standing;Wash/dry hands               Lower Body Dressing: Min guard;Sit to/from stand   Toilet Transfer: Min guard;Ambulation;RW;BSC   Toileting- Water quality scientist and Hygiene: Min guard;Sit to/from stand   Tub/ Shower Transfer: Min guard;Cueing for safety;Cueing for sequencing;Ambulation;3 in 1;Rolling walker Tub/Shower Transfer Details (indicate cue type and reason): VC's for sequencing, safety. Pt able to verbalize sequence with teach back method after practice. Per pt request, attempted with crutches but family/pt in agreement that this transfer is safer with RW. Pt and family plan to have pt utilize RW for ADL in bathroom (tub/toilet transfer) in order to increase safety. Functional mobility during ADLs: Rolling walker;Min guard (Crutches) General ADL Comments: Pt and family educated on adhering to [facility] precuations during ADLs, safe use of RW during tub transfer, and fall prevention.       Cognition   Behavior During Therapy: WFL for tasks assessed/performed Overall Cognitive Status: Within Functional Limits for tasks assessed                                    Pertinent Vitals/ Pain       Pain Assessment: 0-10 Pain Score:  5  Pain Location: L knee after activity Pain Descriptors / Indicators: Aching;Burning Pain Intervention(s): Monitored during session;Ice applied;Repositioned         Frequency  Min 2X/week        Progress Toward Goals  OT Goals(current goals can now be found in the care plan section)  Progress towards OT goals: Progressing toward goals (Goals adequate for D/C.)  Acute Rehab OT Goals Patient Stated  Goal: to go home and get back to normal OT Goal Formulation: With patient Time For Goal Achievement: 10/10/16 Potential to Achieve Goals: Good ADL Goals Pt Will Perform Tub/Shower Transfer: with supervision;3 in 1;ambulating;rolling walker  Plan Discharge plan remains appropriate       End of Session Equipment Utilized During Treatment: Gait belt;Rolling walker (Crutches)   Activity Tolerance Patient tolerated treatment well   Patient Left in bed;with call bell/phone within reach;with family/visitor present   Nurse Communication Other (comment) (OT signing off.)        Time: 9611-6435 OT Time Calculation (min): 42 min  Charges: OT General Charges $OT Visit: 1 Procedure OT Treatments $Self Care/Home Management : 38-52 mins  Norman Herrlich, OTR/L [phone] 09/27/2016, 11:14 AM

## 2016-09-27 NOTE — Discharge Summary (Signed)
Joni Fears, MD   Biagio Borg, PA-C [address] [place], [place]  [address]                             [phone]  PATIENT ID: YADRIAN HIBMA        MRN:  [id number]          DOB/AGE: 62/30/1957 / 60 y.o.    DISCHARGE SUMMARY  ADMISSION DATE:    09/25/2016 DISCHARGE DATE:   09/27/2016   ADMISSION DIAGNOSIS: LEFT KNEE OSTEOARTHRITIS    DISCHARGE DIAGNOSIS:  LEFT KNEE OSTEOARTHRITIS    ADDITIONAL DIAGNOSIS: Principal Problem:   Primary osteoarthritis of left knee Active Problems:   S/P total knee replacement using cement, left  Past Medical History:  Diagnosis Date  . Cancer [facility])    bladder cancer  . Gout     PROCEDURE: Procedure(s): TOTAL KNEE ARTHROPLASTY Left on 09/25/2016  CONSULTS: none    HISTORY: [place] is a very pleasant, 60 year old, white male who is seen today for evaluation of his left knee. I initially saw him back in February 2017 and at that time he had at least a 62-month history of pain in the left knee with a feeling almost as if it wanted to collapse on him. However, on January 07, 2016, he was in the yard and felt as if it was going to collapse and did have a problem where he started having pain and discomfort and was unable to fully extend the knee. This was an acute onset. It was not really traumatic at all. He was just walking in the yard. He had at that time a dull aching pain, but he was finally able to extend the knee. He did have an arthroscopy back in 2008 by [facility] for the left knee and we are not really sure exactly what was done, but at least a meniscectomy. He did have problems with weightbearing and could not extend the knee at that time. A corticosteroid injection was instilled at that time. 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 He followed back and he was losing more motion in March. However, he  really did not follow back up anymore for his knee, but then noted his symptoms were worsening. We had already talked about a total knee replacement as a definitive procedure. He continued to have pain and discomfort and finally decided to consider re-evaluation and consideration for a left total knee arthroplasty. He has tried anti-inflammatories, corticosteroid injection and hehas tried a home exercise program and these have failed. He has pain pretty much now with every step and nighttime pain. He is limited with his activities of daily living secondary to this pain  HOSPITAL COURSE:  JUSITN FEI is a 60 y.o. admitted on 09/25/2016 and found to have a diagnosis of [place].  After appropriate laboratory studies were obtained  they were taken to the operating room on 09/25/2016 and underwent  Procedure(s): TOTAL KNEE ARTHROPLASTY  Left.   They were given perioperative antibiotics:  Anti-infectives    Start     Dose/Rate Route Frequency Ordered Stop   09/25/16 1430  ceFAZolin (ANCEF) IVPB 2g/100 mL premix     2 g 200 mL/hr over 30 Minutes Intravenous Every 6 hours 09/25/16 1351 09/25/16 2338   09/25/16 0544  ceFAZolin (ANCEF) IVPB 2g/100 mL premix  2 g 200 mL/hr over 30 Minutes Intravenous On call to O.R. 09/25/16 [id number] 09/25/16 0740    .  Tolerated the procedure well.  Straight cathed intraoperatively.     Toradol was given post op.  POD #1, allowed out of bed to a chair.  PT for ambulation and exercise program.  IV saline locked.  O2 discontionued.  POD #2, continued PT and ambulation.  Hemovac pulled.  The remainder of the hospital course was dedicated to ambulation and strengthening.   The patient was discharged on 2 Days Post-Op in  Stable condition.  Blood products given:none  DIAGNOSTIC STUDIES: Recent vital signs:  Patient Vitals for the past 24 hrs:  BP Temp Temp src Pulse Resp SpO2  09/27/16 0423 115/64 97.5 F (36.4 C) Oral 77 18 94 %  09/26/16  1959 112/70 97.9 F (36.6 C) Oral 81 18 92 %  09/26/16 1420 112/64 97.7 F (36.5 C) - 78 18 95 %       Recent laboratory studies:  Recent Labs  09/26/16 0636 09/27/16 0348  WBC 5.4 6.5  HGB 12.3* 10.7*  HCT 38.0* 33.4*  PLT 168 163    Recent Labs  09/26/16 0636 09/27/16 0348  NA 136 136  K 3.9 4.0  CL 103 97*  CO2 27 27  BUN 6 <5*  CREATININE 0.73 0.70  GLUCOSE 102* 102*  CALCIUM 8.5* 8.7*   Lab Results  Component Value Date   INR 1.01 09/18/2016     Recent Radiographic Studies :  Dg Chest 2 View  Result Date: 09/18/2016 CLINICAL DATA:  Left knee replacement. EXAM: CHEST  2 VIEW COMPARISON:  No recent prior . FINDINGS: Mediastinum and hilar structures normal. Mild bilateral from interstitial prominence noted. These changes may be related chronic interstitial lung disease. Active pneumonitis cannot be excluded . No pleural effusion or pneumothorax low lung volumes with mild bibasilar atelectasis. IMPRESSION: Low lung volumes with mild bibasilar atelectasis. Interstitial prominence noted bilaterally. These changes most likely related chronic interstitial lung disease. Active pneumonitis cannot be excluded. Electronically Signed   By: Marcello Moores  Register   On: 09/18/2016 12:26    DISCHARGE INSTRUCTIONS: Discharge Instructions    CPM    Complete by:  As directed    Continuous passive motion machine (CPM):      Use the CPM from 0 to 60 degrees for 6-8 hours per day.      You may increase by 5-10 per day.  You may break it up into 2 or 3 sessions per day.      Use CPM for 3-4 weeks or until you are told to stop.   Call MD / Call 911    Complete by:  As directed    If you experience chest pain or shortness of breath, CALL 911 and be transported to the hospital emergency room.  If you develope a fever above 101 F, pus (white drainage) or increased drainage or redness at the wound, or calf pain, call your surgeon's office.   Change dressing    Complete by:  As directed    DO  NOT CHANGE YOUR DRESSING.   Constipation Prevention    Complete by:  As directed    Drink plenty of fluids.  Prune juice may be helpful.  You may use a stool softener, such as Colace (over the counter) 100 mg twice a day.  Use MiraLax (over the counter) for constipation as needed.   Diet general    Complete by:  As directed    Discharge instructions    Complete by:  As directed    INSTRUCTIONS AFTER JOINT REPLACEMENT   Remove items at home which could result in a fall. This includes throw rugs or furniture in walking pathways ICE to the affected joint every three hours while awake for 30 minutes at a time, for at least the first 3-5 days, and then as needed for pain and swelling.  Continue to use ice for pain and swelling. You may notice swelling that will progress down to the foot and ankle.  This is normal after surgery.  Elevate your leg when you are not up walking on it.   Continue to use the breathing machine you got in the hospital (incentive spirometer) which will help keep your temperature down.  It is common for your temperature to cycle up and down following surgery, especially at night when you are not up moving around and exerting yourself.  The breathing machine keeps your lungs expanded and your temperature down.   DIET:  As you were doing prior to hospitalization, we recommend a well-balanced diet.  DRESSING / WOUND CARE / SHOWERING  Keep the surgical dressing until follow up.  The dressing is water proof, so you can shower without any extra covering.  IF THE DRESSING FALLS OFF or the wound gets wet inside, change the dressing with sterile gauze.  Please use good hand washing techniques before changing the dressing.  Do not use any lotions or creams on the incision until instructed by your surgeon.    ACTIVITY  Increase activity slowly as tolerated, but follow the weight bearing instructions below.   No driving for 6 weeks or until further direction given by your physician.   You cannot drive while taking narcotics.  No lifting or carrying greater than 10 lbs. until further directed by your surgeon. Avoid periods of inactivity such as sitting longer than an hour when not asleep. This helps prevent blood clots.  You may return to work once you are authorized by your doctor.     WEIGHT BEARING   Partial weight bearing with assist device as directed.  50% as taught in PT   EXERCISES  Results after joint replacement surgery are often greatly improved when you follow the exercise, range of motion and muscle strengthening exercises prescribed by your doctor. Safety measures are also important to protect the joint from further injury. Any time any of these exercises cause you to have increased pain or swelling, decrease what you are doing until you are comfortable again and then slowly increase them. If you have problems or questions, call your caregiver or physical therapist for advice.   Rehabilitation is important following a joint replacement. After just a few days of immobilization, the muscles of the leg can become weakened and shrink (atrophy).  These exercises are designed to build up the tone and strength of the thigh and leg muscles and to improve motion. Often times heat used for twenty to thirty minutes before working out will loosen up your tissues and help with improving the range of motion but do not use heat for the first two weeks following surgery (sometimes heat can increase post-operative swelling).   These exercises can be done on a training (exercise) mat,  on a table or on a bed. Use whatever works the best and is most comfortable for you.    Use music or television while you are exercising so that the exercises are a pleasant break  in your day. This will make your life better with the exercises acting as a break in your routine that you can look forward to.   Perform all exercises about fifteen times, three times per day or as directed.  You should  exercise both the operative leg and the other leg as well.   Exercises include:  Quad Sets - Tighten up the muscle on the front of the thigh (Quad) and hold for 5-10 seconds.   Straight Leg Raises - With your knee straight (if you were given a brace, keep it on), lift the leg to 60 degrees, hold for 3 seconds, and slowly lower the leg.  Perform this exercise against resistance later as your leg gets stronger.  Leg Slides: Lying on your back, slowly slide your foot toward your buttocks, bending your knee up off the floor (only go as far as is comfortable). Then slowly slide your foot back down until your leg is flat on the floor again.  Angel Wings: Lying on your back spread your legs to the side as far apart as you can without causing discomfort.  Hamstring Strength:  Lying on your back, push your heel against the floor with your leg straight by tightening up the muscles of your buttocks.  Repeat, but this time bend your knee to a comfortable angle, and push your heel against the floor.  You may put a pillow under the heel to make it more comfortable if necessary.   A rehabilitation program following joint replacement surgery can speed recovery and prevent re-injury in the future due to weakened muscles. Contact your doctor or a physical therapist for more information on knee rehabilitation.    CONSTIPATION  Constipation is defined medically as fewer than three stools per week and severe constipation as less than one stool per week.  Even if you have a regular bowel pattern at home, your normal regimen is likely to be disrupted due to multiple reasons following surgery.  Combination of anesthesia, postoperative narcotics, change in appetite and fluid intake all can affect your bowels.   YOU MUST use at least one of the following options; they are listed in order of increasing strength to get the job done.  They are all available over the counter, and you may need to use some, POSSIBLY even all of  these options:    Drink plenty of fluids (prune juice may be helpful) and high fiber foods Colace 100 mg by mouth twice a day  Senokot for constipation as directed and as needed Dulcolax (bisacodyl), take with full glass of water  Miralax (polyethylene glycol) once or twice a day as needed.  If you have tried all these things and are unable to have a bowel movement in the first 3-4 days after surgery call either your surgeon or your primary doctor.    If you experience loose stools or diarrhea, hold the medications until you stool forms back up.  If your symptoms do not get better within 1 week or if they get worse, check with your doctor.  If you experience "the worst abdominal pain ever" or develop nausea or vomiting, please contact the office immediately for further recommendations for treatment.   ITCHING:  If you experience itching with your medications, try taking only a single pain pill, or even half a pain pill at a time.  You can also use Benadryl over the counter for itching or also to help with sleep.   TED HOSE STOCKINGS:  Use stockings  on both legs until for at least 2 weeks or as directed by physician office. They may be removed at night for sleeping.  MEDICATIONS:  See your medication summary on the "After Visit Summary" that nursing will review with you.  You may have some home medications which will be placed on hold until you complete the course of blood thinner medication.  It is important for you to complete the blood thinner medication as prescribed.  PRECAUTIONS:  If you experience chest pain or shortness of breath - call 911 immediately for transfer to the hospital emergency department.   If you develop a fever greater that 101 F, purulent drainage from wound, increased redness or drainage from wound, foul odor from the wound/dressing, or calf pain - CONTACT YOUR SURGEON.                                                   FOLLOW-UP APPOINTMENTS:  If you do not already have  a post-op appointment, please call the office for an appointment to be seen by your surgeon.  Guidelines for how soon to be seen are listed in your "After Visit Summary", but are typically between 1-4 weeks after surgery.  OTHER INSTRUCTIONS:   Knee Replacement:  Do not place pillow under knee, focus on keeping the knee straight while resting. CPM instructions: 0-90 degrees, 2 hours in the morning, 2 hours in the afternoon, and 2 hours in the evening. Place foam block, curve side up under heel at all times except when in CPM or when walking.  DO NOT modify, tear, cut, or change the foam block in any way.  MAKE SURE YOU:  Understand these instructions.  Get help right away if you are not doing well or get worse.    Thank you for letting us be a part of your medical care team.  It is a privilege we respect greatly.  We hope these instructions will help you stay on track for a fast and full recovery!   Do not put a pillow under the knee. Place it under the heel.    Complete by:  As directed    Driving restrictions    Complete by:  As directed    No driving for 6 weeks   Increase activity slowly as tolerated    Complete by:  As directed    Lifting restrictions    Complete by:  As directed    No lifting for 6 weeks   Partial weight bearing    Complete by:  As directed    % Body Weight:  50%   Laterality:  left   Extremity:  Lower   Patient may shower    Complete by:  As directed    You may shower over your dressing   TED hose    Complete by:  As directed    Use stockings (TED hose) for 3 weeks on left  leg.  You may remove them at night for sleeping.      DISCHARGE MEDICATIONS:     Medication List    STOP taking these medications   naproxen sodium 220 MG tablet Commonly known as:  ANAPROX     TAKE these medications   colchicine 0.6 MG tablet Take 0.6 mg by mouth daily as needed (gout pain).   cyanocobalamin 2000 MCG tablet Take 2,000  mcg by mouth every other day.     methocarbamol 500 MG tablet Commonly known as:  ROBAXIN Take 1 tablet (500 mg total) by mouth every 8 (eight) hours as needed for muscle spasms.   oxyCODONE-acetaminophen 5-325 MG tablet Commonly known as:  ROXICET Take 1-2 tablets by mouth every 4 (four) hours as needed for moderate pain or severe pain.   rivaroxaban 10 MG Tabs tablet Commonly known as:  XARELTO Take 1 tablet (10 mg total) by mouth daily with breakfast.   varenicline 0.5 MG tablet Commonly known as:  CHANTIX Take 1 tablet (0.5 mg total) by mouth 2 (two) times daily.       FOLLOW UP VISIT:   Follow-up Information    Garald Balding, MD Follow up on 10/08/2016.   Specialty:  Orthopedic Surgery Contact information: [place]. [place] [address] [phone]           DISPOSITION:   Home  CONDITION:  Stable   Mike Craze. [place], [place] [phone]  09/27/2016 10:01 AM

## 2016-09-27 NOTE — Progress Notes (Signed)
Physical Therapy Treatment Patient Details Name: Edwin Hudson MRN: [id number] DOB: 08/28/1956 Today's Date: 09/27/2016    History of Present Illness 60 y.o. male admitted to [facility] on 09/25/16 for elective L TKA.  Pt with significant PMHx of gout, and bladder CA.    PT Comments    Pt performed increased gait and reviewed stair training in prep for d/c home.  PTA also reviewed exercises in prep for d/c home.  Pt is now ready for d/c home.    Follow Up Recommendations  Home health PT;Supervision for mobility/OOB     Equipment Recommendations  None recommended by PT    Recommendations for Other Services       Precautions / Restrictions Precautions Precautions: Knee Precaution Booklet Issued: Yes (comment) Precaution Comments: knee exercise handout given and reviewed Restrictions Weight Bearing Restrictions: Yes LLE Weight Bearing: Partial weight bearing LLE Partial Weight Bearing Percentage or Pounds: 50 %    Mobility  Bed Mobility Overal bed mobility: Needs Assistance Bed Mobility: Supine to Sit;Sit to Supine     Supine to sit: Supervision Sit to supine: Supervision;HOB elevated   General bed mobility comments: Pt able to self assist LLE to edge of bed with increased time.    Transfers Overall transfer level: Needs assistance Equipment used: Crutches Transfers: Sit to/from Stand Sit to Stand: Supervision         General transfer comment: Good technique, supervision for safety with technique.    Ambulation/Gait Ambulation/Gait assistance: Supervision Ambulation Distance (Feet): 200 Feet Assistive device: Crutches Gait Pattern/deviations: Step-through pattern;Antalgic;Trunk flexed Gait velocity: decreased   General Gait Details: Cues for upper trunk control and UEs use to maintain PWB.  Cues for foot contact with L (pt currently toe walking).     Stairs Stairs: Yes Stairs assistance: Min guard Stair Management: No rails;Step to  pattern;Backwards;Forwards Number of Stairs: 6 General stair comments: x3 forward negotiation and x3 backwards negotiation.  Pt required cues for sequencing and crutch placement.    Wheelchair Mobility    Modified Rankin (Stroke Patients Only)       Balance Overall balance assessment: Needs assistance Sitting-balance support: Feet supported;No upper extremity supported Sitting balance-Leahy Scale: Good     Standing balance support: Single extremity supported;During functional activity Standing balance-Leahy Scale: Fair Standing balance comment: Able to remove R hand from RW to wash hands at sink.                    Cognition Arousal/Alertness: Awake/alert Behavior During Therapy: WFL for tasks assessed/performed Overall Cognitive Status: Within Functional Limits for tasks assessed                      Exercises Total Joint Exercises Ankle Circles/Pumps: AROM;Both;10 reps;Supine Quad Sets: AROM;Left;10 reps;Supine Towel Squeeze: AROM;Both;10 reps;Supine Short Arc Quad: AAROM;Left;10 reps Heel Slides: AAROM;Left;10 reps Hip ABduction/ADduction: AROM;Left;10 reps Straight Leg Raises: AAROM;Left;10 reps Long Arc Quad: AAROM;Left;10 reps Goniometric ROM: 65 degrees flexion L knee.      General Comments        Pertinent Vitals/Pain Pain Assessment: 0-10 Pain Score: 5  Pain Location: L knee after activity Pain Descriptors / Indicators: Aching;Burning Pain Intervention(s): Monitored during session;Repositioned;Ice applied    Home Living                      Prior Function            PT Goals (current goals can now be found in the  care plan section) Acute Rehab PT Goals Patient Stated Goal: to go home and get back to normal Potential to Achieve Goals: Good Progress towards PT goals: Progressing toward goals    Frequency    7X/week      PT Plan Current plan remains appropriate    Co-evaluation             End of Session  Equipment Utilized During Treatment: Gait belt Activity Tolerance: Patient limited by pain Patient left: in bed;with call bell/phone within reach;in CPM     Time: [id number] PT Time Calculation (min) (ACUTE ONLY): 30 min  Charges:  $Gait Training: 8-22 mins $Therapeutic Exercise: 8-22 mins                    G Codes:      Cristela Blue 2016/10/15, 2:35 PM Governor Rooks, PTA pager [phone]

## 2016-09-27 NOTE — Progress Notes (Signed)
PATIENT ID: Edwin Hudson        MRN:  [id number]          DOB/AGE: Sep 18, 1956 / 60 y.o.    Joni Fears, MD   Biagio Borg, PA-C [address] [place], [place]  [address]                             [phone]   PROGRESS NOTE  Subjective:  negative for Chest Pain  negative for Shortness of Breath  negative for Nausea/Vomiting   negative for Calf Pain    Tolerating Diet: yes         Patient reports pain as moderate.     Had a bad night but better this morning  Objective: Vital signs in last 24 hours:   Patient Vitals for the past 24 hrs:  BP Temp Temp src Pulse Resp SpO2  09/27/16 0423 115/64 97.5 F (36.4 C) Oral 77 18 94 %  09/26/16 1959 112/70 97.9 F (36.6 C) Oral 81 18 92 %  09/26/16 1420 112/64 97.7 F (36.5 C) - 78 18 95 %      Intake/Output from previous day:   10/04 0701 - 10/05 0700 In: 480 [P.O.:480] Out: 2350 [Urine:2100; Drains:250]   Intake/Output this shift:   No intake/output data recorded.   Intake/Output      10/04 0701 - 10/05 0700 10/05 0701 - 10/06 0700   P.O. 480    I.V. (mL/kg)     Total Intake(mL/kg) 480 (7)    Urine (mL/kg/hr) 2100 (1.3)    Drains 250 (0.2)    Blood     Total Output 2350     Net -1870             LABORATORY DATA:  Recent Labs  09/26/16 0636 09/27/16 0348  WBC 5.4 6.5  HGB 12.3* 10.7*  HCT 38.0* 33.4*  PLT 168 163    Recent Labs  09/26/16 0636 09/27/16 0348  NA 136 136  K 3.9 4.0  CL 103 97*  CO2 27 27  BUN 6 <5*  CREATININE 0.73 0.70  GLUCOSE 102* 102*  CALCIUM 8.5* 8.7*   Lab Results  Component Value Date   INR 1.01 09/18/2016    Recent Radiographic Studies :  Dg Chest 2 View  Result Date: 09/18/2016 CLINICAL DATA:  Left knee replacement. EXAM: CHEST  2 VIEW COMPARISON:  No recent prior . FINDINGS: Mediastinum and hilar structures normal. Mild bilateral from interstitial prominence noted. These changes may be related chronic interstitial lung disease. Active pneumonitis cannot be  excluded . No pleural effusion or pneumothorax low lung volumes with mild bibasilar atelectasis. IMPRESSION: Low lung volumes with mild bibasilar atelectasis. Interstitial prominence noted bilaterally. These changes most likely related chronic interstitial lung disease. Active pneumonitis cannot be excluded. Electronically Signed   By: Marcello Moores  Register   On: 09/18/2016 12:26     Examination:  General appearance: alert, cooperative, mild distress and moderate distress  Wound Exam: clean, dry, intact   Drainage:  None: wound tissue dry  Motor Exam: EHL, FHL, Anterior Tibial and Posterior Tibial Intact  Sensory Exam: Superficial Peroneal, Deep Peroneal and Tibial normal  Vascular Exam: Left posterior tibial artery has 1+ (weak) pulse  Assessment:    2 Days Post-Op  Procedure(s) (LRB): TOTAL KNEE ARTHROPLASTY (Left)  ADDITIONAL DIAGNOSIS:  Principal Problem:   Primary osteoarthritis of left knee Active Problems:   S/P total knee replacement  using cement, left     Plan: Physical Therapy as ordered Partial Weight Bearing @ 50% (PWB)  DVT Prophylaxis:  Xarelto, Foot Pumps and TED hose  DISCHARGE PLAN: Home  DISCHARGE NEEDS: HHPT, CPM, Walker and 3-in-1 comode seat        [facility]  09/27/2016 9:51 AM  Patient ID: Edwin Hudson, male   DOB: Jan 07, 1956, 60 y.o.   MRN: [id number]

## 2016-09-27 NOTE — Progress Notes (Signed)
Discharge instructions reviewed with patient. Patient stated understanding. IV removed. Patient waiting for family to take him home.

## 2016-09-27 NOTE — Progress Notes (Signed)
Orthopedic Tech Progress Note Patient Details:  Edwin Hudson May 01, 1956 [id number]  Patient ID: Edwin Hudson, male   DOB: 03/16/1956, 60 y.o.   MRN: [id number] Pt refused cpm  Karolee Stamps 09/27/2016, 6:11 AM

## 2016-10-08 ENCOUNTER — Inpatient Hospital Stay (INDEPENDENT_AMBULATORY_CARE_PROVIDER_SITE_OTHER): Payer: Medicaid Other | Admitting: Orthopaedic Surgery

## 2016-10-08 DIAGNOSIS — M25562 Pain in left knee: Secondary | ICD-10-CM

## 2016-10-08 DIAGNOSIS — M1712 Unilateral primary osteoarthritis, left knee: Secondary | ICD-10-CM | POA: Diagnosis not present

## 2016-10-18 ENCOUNTER — Telehealth (INDEPENDENT_AMBULATORY_CARE_PROVIDER_SITE_OTHER): Payer: Self-pay | Admitting: Orthopaedic Surgery

## 2016-10-18 ENCOUNTER — Encounter (INDEPENDENT_AMBULATORY_CARE_PROVIDER_SITE_OTHER): Payer: Self-pay

## 2016-10-18 ENCOUNTER — Other Ambulatory Visit (INDEPENDENT_AMBULATORY_CARE_PROVIDER_SITE_OTHER): Payer: Self-pay | Admitting: Orthopedic Surgery

## 2016-10-18 MED ORDER — HYDROCODONE-ACETAMINOPHEN 5-325 MG PO TABS: 1.0000 | ORAL_TABLET | ORAL | 0 refills | Status: DC | PRN

## 2016-10-18 NOTE — Telephone Encounter (Signed)
Sent RX request for BP

## 2016-10-18 NOTE — Telephone Encounter (Signed)
This encounter was created in error - please disregard.

## 2016-10-18 NOTE — Telephone Encounter (Signed)
Patient called requesting refill of oxycodone.

## 2016-10-18 NOTE — Telephone Encounter (Signed)
Edwin Hudson from [facility] called about needing an rx for therapy, op notes and several codes from the surgery. [phone]

## 2016-10-23 ENCOUNTER — Telehealth (INDEPENDENT_AMBULATORY_CARE_PROVIDER_SITE_OTHER): Payer: Self-pay | Admitting: Orthopaedic Surgery

## 2016-10-23 DIAGNOSIS — Z9889 Other specified postprocedural states: Secondary | ICD-10-CM

## 2016-10-23 NOTE — Telephone Encounter (Signed)
Faxed PT referral to [facility]

## 2016-10-23 NOTE — Telephone Encounter (Signed)
[name] is requesting the written order for physical therapy for patient by today because they are trying to file medicaid and he states it will get denied without a written order. Please advise.

## 2016-10-23 NOTE — Telephone Encounter (Signed)
PLEASE CHECK ON THIS.Marland KitchenMarland KitchenTHANKS

## 2016-10-24 ENCOUNTER — Ambulatory Visit (INDEPENDENT_AMBULATORY_CARE_PROVIDER_SITE_OTHER): Payer: Medicaid Other | Admitting: Orthopedic Surgery

## 2016-10-24 NOTE — Telephone Encounter (Signed)
faxed all the forms on 10/23/16.

## 2016-10-25 ENCOUNTER — Encounter (INDEPENDENT_AMBULATORY_CARE_PROVIDER_SITE_OTHER): Payer: Self-pay | Admitting: Orthopedic Surgery

## 2016-10-25 ENCOUNTER — Ambulatory Visit (INDEPENDENT_AMBULATORY_CARE_PROVIDER_SITE_OTHER): Payer: Medicaid Other | Admitting: Orthopedic Surgery

## 2016-10-25 VITALS — BP 122/72 | HR 74 | Ht 69.0 in | Wt 144.0 lb

## 2016-10-25 DIAGNOSIS — Z96652 Presence of left artificial knee joint: Secondary | ICD-10-CM

## 2016-10-25 MED ORDER — HYDROCODONE-ACETAMINOPHEN 5-325 MG PO TABS: 1.0000 | ORAL_TABLET | ORAL | 0 refills | Status: DC | PRN

## 2016-10-25 NOTE — Progress Notes (Signed)
Office Visit Note   Patient: Edwin Hudson           Date of Birth: 1956-10-24           MRN: [id number] Visit Date: 10/25/2016              Requested by: No referring provider defined for this encounter. PCP: Pcp Not In System   Assessment & Plan: Visit Diagnoses:  1. S/P total knee arthroplasty, left     Plan:  #1: I've tried to work with him to increase his extension. He showed of different techniques that he could try. He will do this as much as he can.  #2 description for for nighttime pain   Follow-Up Instructions: Return in about 2 weeks (around 11/08/2016).   Orders:  No orders of the defined types were placed in this encounter.  Meds ordered this encounter  Medications  . HYDROcodone-acetaminophen (NORCO) 5-325 MG tablet    Sig: Take 1 tablet by mouth every 4 (four) hours as needed for moderate pain.    Dispense:  30 tablet    Refill:  0    Order Specific Question:   Supervising Provider    Answer:   Garald Balding [id number]      Procedures: No procedures performed   Clinical Data: No additional findings.   Subjective: Chief Complaint  Patient presents with  . Left Knee - Pain    Knee replacement Oct 3rd, improving    Edwin Hudson is now 4 weeks status post total knee arthroplasty. He certainly is having some problems with his range of motion especially in that of extension. He continues to his physical therapy as best he can. Eyes and calf pain.    Review of Systems  uA 14-point review of systems is positive for tinnitus in both ears for the past year. He states it is slight. He has upper and lower dentures. He does have a morning cough, consistent with the cigarette smoking that he does. He does have a history of gout and is treated with colchicine. He has nocturia x1. He has lost 20 pounds over the past year because of appetite loss   Objective: Vital Signs: BP 122/72   Pulse 74   Ht 5\' 9"  (1.753 m)   Wt 144 lb (65.3 kg)   BMI 21.27 kg/m     Physical Exam  Constitutional: He is oriented to person, place, and time. He appears well-developed and well-nourished.  HENT:  Head: Normocephalic and atraumatic.  Eyes: EOM are normal. Pupils are equal, round, and reactive to light.  Neck:  No carotid bruits  Cardiovascular: Normal rate.   Pulmonary/Chest: Effort normal.  Neurological: He is alert and oriented to person, place, and time.  Skin: Skin is warm and dry.  Psychiatric: He has a normal mood and affect. His behavior is normal. Judgment and thought content normal.    Left Knee Exam   Range of Motion  Extension: 20  Left knee flexion: 95.   Other  Scars: present Sensation: normal Pulse: present Swelling: mild      Specialty Comments:  No specialty comments available.  Imaging: No results found.   PMFS History: Patient Active Problem List   Diagnosis Date Noted  . Primary osteoarthritis of left knee 09/25/2016  . S/P total knee replacement using cement, left 09/25/2016  . Gout 07/29/2012   Past Medical History:  Diagnosis Date  . Cancer [facility])    bladder cancer  . Gout  No family history on file.  Past Surgical History:  Procedure Laterality Date  . COLONOSCOPY    . KNEE SURGERY Right 1990  . TOTAL KNEE ARTHROPLASTY Left 09/25/2016   Procedure: TOTAL KNEE ARTHROPLASTY;  Surgeon: Garald Balding, MD;  Location: [place];  Service: Orthopedics;  Laterality: Left;  block done per Dr. Linna Caprice at [place] History  . Not on file.   Social History Main Topics  . Smoking status: Current Every Day Smoker    Packs/day: 1.00    Years: 40.00    Types: Cigarettes  . Smokeless tobacco: Never Used  . Alcohol use 0.0 oz/week     Comment: usually on weekends  . Drug use: No  . Sexual activity: Not on file

## 2016-11-12 ENCOUNTER — Ambulatory Visit (INDEPENDENT_AMBULATORY_CARE_PROVIDER_SITE_OTHER): Payer: Medicaid Other | Admitting: Orthopaedic Surgery

## 2016-11-12 ENCOUNTER — Encounter (INDEPENDENT_AMBULATORY_CARE_PROVIDER_SITE_OTHER): Payer: Self-pay | Admitting: Orthopaedic Surgery

## 2016-11-12 VITALS — BP 123/70 | HR 70 | Resp 14 | Ht 68.0 in | Wt 144.0 lb

## 2016-11-12 DIAGNOSIS — M1712 Unilateral primary osteoarthritis, left knee: Secondary | ICD-10-CM

## 2016-11-12 MED ORDER — TRAMADOL HCL 50 MG PO TABS: 50.0000 mg | ORAL_TABLET | Freq: Two times a day (BID) | ORAL | 0 refills | Status: DC

## 2016-11-12 NOTE — Progress Notes (Signed)
   Office Visit Note   Patient: Edwin Hudson           Date of Birth: [birth year]           MRN: [id number] Visit Date: 11/12/2016              Requested by: No referring provider defined for this encounter. PCP: Pcp Not In System   Assessment & Plan: Visit Diagnoses: No diagnosis found.  Plan:f/u 3 months  Follow-Up Instructions: No Follow-up on file. I have encouraged Mr. Edwin Hudson to perform home exercises. He does have Medicaid and is limited on his physical therapy sessions. He does have a friend with some "waits" and an exercise bicycle. I will also give him prescription for tramadol No. 40 to use sparingly just to sleep . He really needs to work on exercises's. His quads are quite weak but otherwise I think his knee looks just fine.  No orders of the defined types were placed in this encounter.  No orders of the defined types were placed in this encounter.     Procedures: No procedures performed   Clinical Data: No additional findings.   Subjective: No chief complaint on file.   Pt states he is having trouble sleeping because of his Left knee pain from surgery 09/25/16.  Pt has one more PT scheduled  Left knee is still numb, occasional swelling and tender at the sight    Review of Systems   Objective: Vital Signs: Resp 14   Ht 5\' 8"  (1.727 m)   Wt 144 lb (65.3 kg)   BMI 21.90 kg/m   Physical Exam  Ortho Exam left knee exam reveals minimal effusion. His knee was not hot red or swollen. There is no evidence of any instability with a varus or valgus stress. He flexed over 105. He was tight in extension but I could passively place him in full extension. No calf pain or lower extremity swelling.  Specialty Comments:  No specialty comments available.  Imaging: No results found.   PMFS History: Patient Active Problem List   Diagnosis Date Noted  . Primary osteoarthritis of left knee 09/25/2016  . S/P total knee replacement using cement, left  09/25/2016  . Gout 07/29/2012   Past Medical History:  Diagnosis Date  . Cancer [facility])    bladder cancer  . Gout     No family history on file.  Past Surgical History:  Procedure Laterality Date  . COLONOSCOPY    . KNEE SURGERY Right 1990  . TOTAL KNEE ARTHROPLASTY Left 09/25/2016   Procedure: TOTAL KNEE ARTHROPLASTY;  Surgeon: Garald Balding, MD;  Location: [place];  Service: Orthopedics;  Laterality: Left;  block done per Dr. Linna Caprice at [place] History  . Not on file.   Social History Main Topics  . Smoking status: Current Every Day Smoker    Packs/day: 1.00    Years: 40.00    Types: Cigarettes  . Smokeless tobacco: Never Used  . Alcohol use 0.0 oz/week     Comment: usually on weekends  . Drug use: No  . Sexual activity: Not on file

## 2017-02-11 ENCOUNTER — Encounter (INDEPENDENT_AMBULATORY_CARE_PROVIDER_SITE_OTHER): Payer: Self-pay | Admitting: Orthopaedic Surgery

## 2017-02-11 ENCOUNTER — Ambulatory Visit (INDEPENDENT_AMBULATORY_CARE_PROVIDER_SITE_OTHER): Payer: Medicaid Other | Admitting: Orthopaedic Surgery

## 2017-02-11 VITALS — BP 131/76 | HR 58 | Resp 14 | Ht 68.0 in | Wt 144.0 lb

## 2017-02-11 DIAGNOSIS — Z96652 Presence of left artificial knee joint: Secondary | ICD-10-CM | POA: Diagnosis not present

## 2017-02-11 NOTE — Progress Notes (Signed)
   Office Visit Note   Patient: Edwin Hudson           Date of Birth: [birth year]           MRN: [id number] Visit Date: 02/11/2017              Requested by: No referring provider defined for this encounter. PCP: Pcp Not In System   Assessment & Plan: Visit Diagnoses: 4 months status post left total knee replacement and doing well. No significant complaints.   Plan: Encourage exercises to strengthen  quads and hamstrings. Return to office in 6 months   Follow-Up Instructions: No Follow-up on file.   Orders:  No orders of the defined types were placed in this encounter.  No orders of the defined types were placed in this encounter.     Procedures: No procedures performed   Clinical Data: No additional findings.   Subjective: Chief Complaint  Patient presents with  . Left Knee - Follow-up    Status post 4 months Left total knee replacement. Pt does his exercises every now and then, but mainly walks for exercise. There is a "clickiing" noise in left knee when he moves knee side to side.     Denies fever or chills. No swelling of his knee. No ambulatory aid or use of analgesics  Review of Systems   Objective: Vital Signs: Resp 14   Ht 5\' 8"  (1.727 m)   Wt 144 lb (65.3 kg)   BMI 21.90 kg/m   Physical Exam  Ortho Exam left knee with minimal effusion. Knee was not hot red or swollen. No opening with a varus or valgus stress. About a 3 mm anterior drawer sign. Quads appear to be quite weak. Full extension and 120 of flexion. No popliteal pain. No calf pain or swelling. Vascular exam is intact. Walks without a limp  Specialty Comments:  No specialty comments available.  Imaging: No results found.   PMFS History: Patient Active Problem List   Diagnosis Date Noted  . Primary osteoarthritis of left knee 09/25/2016  . S/P total knee replacement using cement, left 09/25/2016  . Gout 07/29/2012   Past Medical History:  Diagnosis Date  . Cancer [facility])    bladder cancer  . Gout     No family history on file.  Past Surgical History:  Procedure Laterality Date  . COLONOSCOPY    . KNEE SURGERY Right 1990  . TOTAL KNEE ARTHROPLASTY Left 09/25/2016   Procedure: TOTAL KNEE ARTHROPLASTY;  Surgeon: Garald Balding, MD;  Location: [place];  Service: Orthopedics;  Laterality: Left;  block done per Dr. Linna Caprice at [place] History  . Not on file.   Social History Main Topics  . Smoking status: Current Every Day Smoker    Packs/day: 1.00    Years: 40.00    Types: Cigarettes  . Smokeless tobacco: Never Used  . Alcohol use 0.0 oz/week     Comment: usually on weekends  . Drug use: No  . Sexual activity: Not on file

## 2017-08-05 ENCOUNTER — Encounter (INDEPENDENT_AMBULATORY_CARE_PROVIDER_SITE_OTHER): Payer: Self-pay | Admitting: Orthopaedic Surgery

## 2017-08-05 ENCOUNTER — Ambulatory Visit (INDEPENDENT_AMBULATORY_CARE_PROVIDER_SITE_OTHER): Payer: Medicaid Other | Admitting: Orthopaedic Surgery

## 2017-08-05 VITALS — BP 137/74 | HR 64 | Resp 14 | Ht 69.0 in | Wt 145.0 lb

## 2017-08-05 DIAGNOSIS — Z96652 Presence of left artificial knee joint: Secondary | ICD-10-CM

## 2017-08-05 NOTE — Progress Notes (Signed)
Office Visit Note   Patient: ABBIE Hudson           Date of Birth: [birth year]           MRN: [id number] Visit Date: 08/05/2017              Requested by: No referring provider defined for this encounter. PCP: System, Pcp Not In   Assessment & Plan: Visit Diagnoses:  1. History of left knee replacement     Plan: Doing very well 10 months status post primary left total knee replacement without problems. Discussed exercises and need for antibiotics for any invasive procedures. Plan to see back as necessary  Follow-Up Instructions: Return if symptoms worsen or fail to improve.   Orders:  No orders of the defined types were placed in this encounter.  No orders of the defined types were placed in this encounter.     Procedures: No procedures performed   Clinical Data: No additional findings.   Subjective: Chief Complaint  Patient presents with  . Left Knee - Routine Post Op    Edwin Hudson is a 61 y o 10 month status post L TKA. He relates he is doing well, some popping but no pain.  10 months status post primary left total knee replacement. No related issues. Denies fever or chills, shortness of breath or chest pain or distal edema. Not taking any pain medicines. Does not use any ambulatory aid. "I'm much better than I was before surgery.    HPI  Review of Systems  Constitutional: Negative for fatigue.  HENT: Negative for hearing loss.   Respiratory: Negative for apnea, chest tightness and shortness of breath.   Cardiovascular: Negative for chest pain, palpitations and leg swelling.  Gastrointestinal: Negative for blood in stool, constipation and diarrhea.  Genitourinary: Negative for difficulty urinating.  Musculoskeletal: Positive for back pain and joint swelling. Negative for arthralgias, myalgias, neck pain and neck stiffness.  Neurological: Negative for weakness, numbness and headaches.  Hematological: Does not bruise/bleed easily.  Psychiatric/Behavioral:  Negative for sleep disturbance. The patient is not nervous/anxious.      Objective: Vital Signs: BP 137/74   Pulse 64   Resp 14   Ht 5\' 9"  (1.753 m)   Wt 145 lb (65.8 kg)   BMI 21.41 kg/m   Physical Exam  Ortho Exam awake alert and oriented 3. Comfortable sitting. Walks without a limp. Left knee with full extension and 134 of flexion using a goniometer. No instability. Knee was not hot warm or red. No instability with a varus or valgus stress. Straight leg raise negative. Painless range of motion of both hips. Good pulses and normal sensibility in his foot. Still has a little decreased sensibility along the lateral aspect of his knee but no skin change  Specialty Comments:  No specialty comments available.  Imaging: No results found.   PMFS History: Patient Active Problem List   Diagnosis Date Noted  . Primary osteoarthritis of left knee 09/25/2016  . S/P total knee replacement using cement, left 09/25/2016  . Gout 07/29/2012   Past Medical History:  Diagnosis Date  . Cancer [facility])    bladder cancer  . Gout     History reviewed. No pertinent family history.  Past Surgical History:  Procedure Laterality Date  . COLONOSCOPY    . KNEE SURGERY Right 1990  . TOTAL KNEE ARTHROPLASTY Left 09/25/2016   Procedure: TOTAL KNEE ARTHROPLASTY;  Surgeon: Garald Balding, MD;  Location: [place];  Service: Orthopedics;  Laterality: Left;  block done per Dr. Linna Caprice at [place] History  . Not on file.   Social History Main Topics  . Smoking status: Current Every Day Smoker    Packs/day: 1.00    Years: 40.00    Types: Cigarettes  . Smokeless tobacco: Never Used  . Alcohol use 0.0 oz/week     Comment: usually on weekends  . Drug use: No  . Sexual activity: Not on file

## 2017-08-12 ENCOUNTER — Ambulatory Visit (INDEPENDENT_AMBULATORY_CARE_PROVIDER_SITE_OTHER): Payer: Medicaid Other | Admitting: Orthopaedic Surgery

## 2020-12-14 ENCOUNTER — Ambulatory Visit: Payer: Self-pay

## 2020-12-14 ENCOUNTER — Ambulatory Visit: Payer: Medicare Other | Admitting: Orthopaedic Surgery

## 2020-12-14 ENCOUNTER — Encounter: Payer: Self-pay | Admitting: Orthopaedic Surgery

## 2020-12-14 ENCOUNTER — Other Ambulatory Visit: Payer: Self-pay

## 2020-12-14 VITALS — Ht 68.0 in | Wt 160.0 lb

## 2020-12-14 DIAGNOSIS — M545 Low back pain, unspecified: Secondary | ICD-10-CM | POA: Insufficient documentation

## 2020-12-14 MED ORDER — METHYLPREDNISOLONE 4 MG PO TBPK: ORAL_TABLET | ORAL | 0 refills | Status: DC

## 2020-12-14 NOTE — Progress Notes (Signed)
Office Visit Note   Patient: Edwin Hudson           Date of Birth: 06/08/1956           MRN: [id number] Visit Date: 12/14/2020              Requested by: No referring provider defined for this encounter. PCP: Pcp, No   Assessment & Plan: Visit Diagnoses:  1. Acute midline low back pain, unspecified whether sciatica present   2. Acute bilateral low back pain without sciatica     Plan: Mr Edwin Hudson experienced acute onset of low back pain about 48 hours ago.  His pain is localized to his lumbar spine without any referred pain to either hip or either lower extremity.  He is not had any numbness or tingling or obvious weakness.  He denies any bowel or bladder changes.  He also denies any injury or trauma.  He notes that after about 4 hours of sleeping he wakes up with back pain and has difficulty getting back to sleep.  After getting up in the morning after an hour or so he feels fine.  He does have some degenerative change on his x-rays but no acute changes.  I am going to try a Medrol dose pack and if no improvement next week I would suggest an MRI scan Follow-Up Instructions: Return if symptoms worsen or fail to improve.   Orders:  Orders Placed This Encounter  Procedures  . XR Lumbar Spine 2-3 Views   Meds ordered this encounter  Medications  . methylPREDNISolone (MEDROL DOSEPAK) 4 MG TBPK tablet    Sig: Take as directed on package    Dispense:  21 tablet    Refill:  0      Procedures: No procedures performed   Clinical Data: No additional findings.   Subjective: Chief Complaint  Patient presents with  . Lower Back - Pain  Patient presents today for his lower back. He said that he woke two nights ago with pain in his back. No injury. He feels better once he gets up and starts moving around. No numbness or tingling. No leg weakness. His pain does not radiate down his legs. He states that he only has pain after laying down for about 4 hours. His neighbor gave him  hydrocodone and that did help with his pain.    HPI  Review of Systems   Objective: Vital Signs: Ht 5\' 8"  (1.727 m)   Wt 160 lb (72.6 kg)   BMI 24.33 kg/m   Physical Exam Constitutional:      Appearance: He is well-developed and well-nourished.  HENT:     Mouth/Throat:     Mouth: Oropharynx is clear and moist.  Eyes:     Extraocular Movements: EOM normal.     Pupils: Pupils are equal, round, and reactive to light.  Pulmonary:     Effort: Pulmonary effort is normal.  Skin:    General: Skin is warm and dry.  Neurological:     Mental Status: He is alert and oriented to person, place, and time.  Psychiatric:        Mood and Affect: Mood and affect normal.        Behavior: Behavior normal.     Ortho Exam awake alert and oriented x3.  Comfortable sitting.  Straight leg raise negative bilaterally.  Painless range of motion both hips.  He does have some midline percussible tenderness lumbar spine particularly at the lumbosacral junction.  No flank pain.  No sacroiliac discomfort.  No distal edema.  Does not feel weak and has normal sensation.  +1 pulses  Specialty Comments:  No specialty comments available.  Imaging: XR Lumbar Spine 2-3 Views  Result Date: 12/14/2020 Films of the lumbar spine were obtained in 2 projections.  There is very slight anterior listhesis of L5-S1 with some degenerative disc disease i.e. narrowing.  There is anterior osteophyte formation along many of the vertebrae between L2-3 and L5.  There is diffuse calcification of the abdominal aorta but no obvious aneurysmal dilatation.  On limited views of the hips there is a little bit of arthritis in the superior right joint space.  Patient is asymptomatic in regards to his hips    PMFS History: Patient Active Problem List   Diagnosis Date Noted  . Low back pain 12/14/2020  . Primary osteoarthritis of left knee 09/25/2016  . S/P total knee replacement using cement, left 09/25/2016  . Gout 07/29/2012    Past Medical History:  Diagnosis Date  . Cancer [facility])    bladder cancer  . Gout     History reviewed. No pertinent family history.  Past Surgical History:  Procedure Laterality Date  . COLONOSCOPY    . KNEE SURGERY Right 1990  . TOTAL KNEE ARTHROPLASTY Left 09/25/2016   Procedure: TOTAL KNEE ARTHROPLASTY;  Surgeon: Garald Balding, MD;  Location: [place];  Service: Orthopedics;  Laterality: Left;  block done per Dr. Linna Caprice at [place] History  . Not on file  Tobacco Use  . Smoking status: Current Every Day Smoker    Packs/day: 1.00    Years: 40.00    Pack years: 40.00    Types: Cigarettes  . Smokeless tobacco: Never Used  Vaping Use  . Vaping Use: Never used  Substance and Sexual Activity  . Alcohol use: Yes    Alcohol/week: 0.0 standard drinks    Comment: usually on weekends  . Drug use: No  . Sexual activity: Not on file

## 2021-02-11 ENCOUNTER — Emergency Department (HOSPITAL_COMMUNITY): Payer: Medicare Other

## 2021-02-11 ENCOUNTER — Other Ambulatory Visit: Payer: Self-pay

## 2021-02-11 ENCOUNTER — Observation Stay (HOSPITAL_COMMUNITY)
Admission: EM | Admit: 2021-02-11 | Discharge: 2021-02-13 | Disposition: A | Discharge status: Home / Self Care | Payer: Medicare Other | Attending: Internal Medicine | Admitting: Internal Medicine

## 2021-02-11 DIAGNOSIS — Y900 Blood alcohol level of less than 20 mg/100 ml: Secondary | ICD-10-CM | POA: Diagnosis not present

## 2021-02-11 DIAGNOSIS — F17210 Nicotine dependence, cigarettes, uncomplicated: Secondary | ICD-10-CM | POA: Diagnosis not present

## 2021-02-11 DIAGNOSIS — Z8551 Personal history of malignant neoplasm of bladder: Secondary | ICD-10-CM | POA: Diagnosis not present

## 2021-02-11 DIAGNOSIS — Z7289 Other problems related to lifestyle: Secondary | ICD-10-CM

## 2021-02-11 DIAGNOSIS — J9601 Acute respiratory failure with hypoxia: Secondary | ICD-10-CM | POA: Diagnosis present

## 2021-02-11 DIAGNOSIS — M47812 Spondylosis without myelopathy or radiculopathy, cervical region: Secondary | ICD-10-CM | POA: Insufficient documentation

## 2021-02-11 DIAGNOSIS — R55 Syncope and collapse: Secondary | ICD-10-CM | POA: Diagnosis not present

## 2021-02-11 DIAGNOSIS — M109 Gout, unspecified: Secondary | ICD-10-CM | POA: Diagnosis not present

## 2021-02-11 DIAGNOSIS — Z96652 Presence of left artificial knee joint: Secondary | ICD-10-CM | POA: Diagnosis not present

## 2021-02-11 DIAGNOSIS — M25461 Effusion, right knee: Secondary | ICD-10-CM

## 2021-02-11 DIAGNOSIS — W19XXXA Unspecified fall, initial encounter: Secondary | ICD-10-CM | POA: Insufficient documentation

## 2021-02-11 DIAGNOSIS — R52 Pain, unspecified: Secondary | ICD-10-CM

## 2021-02-11 DIAGNOSIS — Z96653 Presence of artificial knee joint, bilateral: Secondary | ICD-10-CM | POA: Insufficient documentation

## 2021-02-11 DIAGNOSIS — Z79899 Other long term (current) drug therapy: Secondary | ICD-10-CM | POA: Diagnosis not present

## 2021-02-11 DIAGNOSIS — R0902 Hypoxemia: Secondary | ICD-10-CM

## 2021-02-11 DIAGNOSIS — Z20822 Contact with and (suspected) exposure to covid-19: Secondary | ICD-10-CM | POA: Insufficient documentation

## 2021-02-11 DIAGNOSIS — J449 Chronic obstructive pulmonary disease, unspecified: Secondary | ICD-10-CM | POA: Insufficient documentation

## 2021-02-11 DIAGNOSIS — Z789 Other specified health status: Secondary | ICD-10-CM

## 2021-02-11 DIAGNOSIS — F1090 Alcohol use, unspecified, uncomplicated: Secondary | ICD-10-CM

## 2021-02-11 DIAGNOSIS — J439 Emphysema, unspecified: Secondary | ICD-10-CM

## 2021-02-11 DIAGNOSIS — Z720 Tobacco use: Secondary | ICD-10-CM

## 2021-02-11 LAB — CBC WITH DIFFERENTIAL/PLATELET
Abs Immature Granulocytes: 0.08 10*3/uL — ABNORMAL HIGH (ref 0.00–0.07)
Basophils Absolute: 0 10*3/uL (ref 0.0–0.1)
Basophils Relative: 0 %
Eosinophils Absolute: 0 10*3/uL (ref 0.0–0.5)
Eosinophils Relative: 0 %
HCT: 46.2 % (ref 39.0–52.0)
Hemoglobin: 15 g/dL (ref 13.0–17.0)
Immature Granulocytes: 1 %
Lymphocytes Relative: 6 %
Lymphs Abs: 0.8 10*3/uL (ref 0.7–4.0)
MCH: 33.3 pg (ref 26.0–34.0)
MCHC: 32.5 g/dL (ref 30.0–36.0)
MCV: 102.7 fL — ABNORMAL HIGH (ref 80.0–100.0)
Monocytes Absolute: 0.6 10*3/uL (ref 0.1–1.0)
Monocytes Relative: 5 %
Neutro Abs: 10.7 10*3/uL — ABNORMAL HIGH (ref 1.7–7.7)
Neutrophils Relative %: 88 %
Platelets: 188 10*3/uL (ref 150–400)
RBC: 4.5 MIL/uL (ref 4.22–5.81)
RDW: 13.2 % (ref 11.5–15.5)
WBC: 12.1 10*3/uL — ABNORMAL HIGH (ref 4.0–10.5)
nRBC: 0 % (ref 0.0–0.2)

## 2021-02-11 LAB — D-DIMER, QUANTITATIVE: D-Dimer, Quant: 1.02 ug/mL-FEU — ABNORMAL HIGH (ref 0.00–0.50)

## 2021-02-11 LAB — COMPREHENSIVE METABOLIC PANEL
ALT: 23 U/L (ref 0–44)
AST: 28 U/L (ref 15–41)
Albumin: 3.8 g/dL (ref 3.5–5.0)
Alkaline Phosphatase: 65 U/L (ref 38–126)
Anion gap: 9 (ref 5–15)
BUN: 10 mg/dL (ref 8–23)
CO2: 24 mmol/L (ref 22–32)
Calcium: 9.3 mg/dL (ref 8.9–10.3)
Chloride: 105 mmol/L (ref 98–111)
Creatinine, Ser: 0.91 mg/dL (ref 0.61–1.24)
GFR, Estimated: >60 mL/min (ref >60)
Glucose, Bld: 165 mg/dL — ABNORMAL HIGH (ref 70–99)
Potassium: 3.8 mmol/L (ref 3.5–5.1)
Sodium: 138 mmol/L (ref 135–145)
Total Bilirubin: 0.6 mg/dL (ref 0.3–1.2)
Total Protein: 7.1 g/dL (ref 6.5–8.1)

## 2021-02-11 LAB — RESP PANEL BY RT-PCR (FLU A&B, COVID) ARPGX2
Influenza A by PCR: NEGATIVE
Influenza B by PCR: NEGATIVE
SARS Coronavirus 2 by RT PCR: NEGATIVE

## 2021-02-11 LAB — MAGNESIUM: Magnesium: 2 mg/dL (ref 1.7–2.4)

## 2021-02-11 LAB — TROPONIN I (HIGH SENSITIVITY): Troponin I (High Sensitivity): 27 ng/L — ABNORMAL HIGH (ref <18)

## 2021-02-11 LAB — ETHANOL: Alcohol, Ethyl (B): <10 mg/dL (ref <10)

## 2021-02-11 MED ORDER — IOHEXOL 350 MG/ML SOLN
80.0000 mL | Freq: Once | INTRAVENOUS | Status: AC | PRN
  Administered 2021-02-11: 80 mL via INTRAVENOUS

## 2021-02-11 MED ORDER — LACTATED RINGERS IV BOLUS
1000.0000 mL | Freq: Once | INTRAVENOUS | Status: AC
  Administered 2021-02-11: 1000 mL via INTRAVENOUS

## 2021-02-11 NOTE — ED Triage Notes (Signed)
Pt BIB [facility] d/t a fall, heard by family & when they saw him in the floor pt was unresponsive. EMS reports pt responded to 1 mg of Narcan & became awake and denying any substance use. Pt is suing crutches from a recent patellar dislocation (per EMS).

## 2021-02-11 NOTE — ED Provider Notes (Signed)
Assumed care from PA Gibbons at shift change.  See prior notes for full H&P.  Briefly, 65 y.o. M here after syncopal event.  Questionable response to narcan at home but patient denies OD or substance abuse.  He is a daily drinker, no seizures/withdrawal symptoms.  Labs with elevated d-dimer and trop.  He is hypoxic with ambulation after just a few steps down to 82-83%.  He is a daily smoker with chronic cough but usually no issues with SOB.  Plan:  CTA pending.  Will need admission.  Results for orders placed or performed during the hospital encounter of 02/11/21  Resp Panel by RT-PCR (Flu A&B, Covid) Nasopharyngeal Swab   Specimen: Nasopharyngeal Swab; Nasopharyngeal(NP) swabs in vial transport medium  Result Value Ref Range   SARS Coronavirus 2 by RT PCR NEGATIVE NEGATIVE   Influenza A by PCR NEGATIVE NEGATIVE   Influenza B by PCR NEGATIVE NEGATIVE  CBC WITH DIFFERENTIAL  Result Value Ref Range   WBC 12.1 (H) 4.0 - 10.5 K/uL   RBC 4.50 4.22 - 5.81 MIL/uL   Hemoglobin 15.0 13.0 - 17.0 g/dL   HCT 46.2 39.0 - 52.0 %   MCV 102.7 (H) 80.0 - 100.0 fL   MCH 33.3 26.0 - 34.0 pg   MCHC 32.5 30.0 - 36.0 g/dL   RDW 13.2 11.5 - 15.5 %   Platelets 188 150 - 400 K/uL   nRBC 0.0 0.0 - 0.2 %   Neutrophils Relative % 88 %   Neutro Abs 10.7 (H) 1.7 - 7.7 K/uL   Lymphocytes Relative 6 %   Lymphs Abs 0.8 0.7 - 4.0 K/uL   Monocytes Relative 5 %   Monocytes Absolute 0.6 0.1 - 1.0 K/uL   Eosinophils Relative 0 %   Eosinophils Absolute 0.0 0.0 - 0.5 K/uL   Basophils Relative 0 %   Basophils Absolute 0.0 0.0 - 0.1 K/uL   Immature Granulocytes 1 %   Abs Immature Granulocytes 0.08 (H) 0.00 - 0.07 K/uL  Comprehensive metabolic panel  Result Value Ref Range   Sodium 138 135 - 145 mmol/L   Potassium 3.8 3.5 - 5.1 mmol/L   Chloride 105 98 - 111 mmol/L   CO2 24 22 - 32 mmol/L   Glucose, Bld 165 (H) 70 - 99 mg/dL   BUN 10 8 - 23 mg/dL   Creatinine, Ser 0.91 0.61 - 1.24 mg/dL   Calcium 9.3 8.9 - 10.3  mg/dL   Total Protein 7.1 6.5 - 8.1 g/dL   Albumin 3.8 3.5 - 5.0 g/dL   AST 28 15 - 41 U/L   ALT 23 0 - 44 U/L   Alkaline Phosphatase 65 38 - 126 U/L   Total Bilirubin 0.6 0.3 - 1.2 mg/dL   GFR, Estimated >60 >60 mL/min   Anion gap 9 5 - 15  Ethanol  Result Value Ref Range   Alcohol, Ethyl (B) <10 <10 mg/dL  Magnesium  Result Value Ref Range   Magnesium 2.0 1.7 - 2.4 mg/dL  D-dimer, quantitative  Result Value Ref Range   D-Dimer, Quant 1.02 (H) 0.00 - 0.50 ug/mL-FEU  Troponin I (High Sensitivity)  Result Value Ref Range   Troponin I (High Sensitivity) 27 (H) <18 ng/L   CT Head Wo Contrast  Result Date: 02/11/2021 CLINICAL DATA:  Status post fall. EXAM: CT HEAD WITHOUT CONTRAST TECHNIQUE: Contiguous axial images were obtained from the base of the skull through the vertex without intravenous contrast. COMPARISON:  None. FINDINGS: Brain: No evidence of  acute infarction, hemorrhage, hydrocephalus, extra-axial collection or mass lesion/mass effect. Vascular: No hyperdense vessel or unexpected calcification. Skull: Normal. Negative for fracture or focal lesion. Sinuses/Orbits: No acute finding. Other: None. IMPRESSION: No acute intracranial pathology. Electronically Signed   By: Virgina Norfolk M.D.   On: 02/11/2021 19:05   CT Angio Chest PE W and/or Wo Contrast  Result Date: 02/11/2021 CLINICAL DATA:  Status post fall. EXAM: CT ANGIOGRAPHY CHEST WITH CONTRAST TECHNIQUE: Multidetector CT imaging of the chest was performed using the standard protocol during bolus administration of intravenous contrast. Multiplanar CT image reconstructions and MIPs were obtained to evaluate the vascular anatomy. CONTRAST:  50mL OMNIPAQUE IOHEXOL 350 MG/ML SOLN COMPARISON:  None. FINDINGS: Cardiovascular: There is mild calcification and atherosclerosis of the aortic arch. Satisfactory opacification of the pulmonary arteries to the segmental level. No evidence of pulmonary embolism. Normal heart size with mild  coronary artery calcification. No pericardial effusion. Mediastinum/Nodes: No enlarged mediastinal, hilar, or axillary lymph nodes. Thyroid gland, trachea, and esophagus demonstrate no significant findings. Lungs/Pleura: Mild emphysematous lung disease is seen involving the bilateral upper lobes, right greater than left. Moderate severity fibrotic changes and honeycombing he is also noted within the posterior aspects of the bilateral lower lobes. Mild atelectasis is also seen within the bilateral lung bases. There is no evidence of a pleural effusion or pneumothorax. Upper Abdomen: No acute abnormality. Musculoskeletal: Multilevel degenerative changes seen throughout the thoracic spine. Review of the MIP images confirms the above findings. IMPRESSION: 1. No evidence of pulmonary embolism. 2. Moderate severity bilateral lower lobe fibrotic changes with mild bibasilar atelectasis. 3. Emphysema and aortic atherosclerosis. Aortic Atherosclerosis (ICD10-I70.0) and Emphysema (ICD10-J43.9). Electronically Signed   By: Virgina Norfolk M.D.   On: 02/11/2021 23:37   CT Cervical Spine Wo Contrast  Result Date: 02/11/2021 CLINICAL DATA:  Status post fall. EXAM: CT CERVICAL SPINE WITHOUT CONTRAST TECHNIQUE: Multidetector CT imaging of the cervical spine was performed without intravenous contrast. Multiplanar CT image reconstructions were also generated. COMPARISON:  None. FINDINGS: Alignment: Normal. Skull base and vertebrae: No acute fracture. No primary bone lesion or focal pathologic process. Soft tissues and spinal canal: No prevertebral fluid or swelling. No visible canal hematoma. Disc levels: Marked severity endplate sclerosis is seen at the level of C5-C6. Moderate to marked severity intervertebral disc space narrowing is also seen at the level of C5-C6. Mild bilateral multilevel facet joint hypertrophy is noted. Upper chest: Negative. Other: None. IMPRESSION: 1. Marked severity degenerative changes at the level of  C5-C6. 2. No evidence of an acute fracture or subluxation. Electronically Signed   By: Virgina Norfolk M.D.   On: 02/11/2021 19:09   DG Chest Port 1 View  Result Date: 02/11/2021 CLINICAL DATA:  Chest pain. EXAM: PORTABLE CHEST 1 VIEW COMPARISON:  September 18, 2016 FINDINGS: Decreased lung volumes are seen which is likely, in part, secondary to the degree of patient inspiration. Mild, diffuse, chronic appearing increased interstitial lung markings are seen. Very mild areas of atelectasis and/or early infiltrate are noted within the bilateral lung bases. There is no evidence of a pleural effusion or pneumothorax. The cardiac silhouette is borderline in size. A thin, linear, radiopaque wire-like structure is seen overlying the cardiac silhouette, to the right of midline. A subcentimeter radiopaque soft tissue foreign body is again seen overlying the mid left clavicle. Degenerative changes seen throughout the thoracic spine. IMPRESSION: Chronic appearing increased interstitial lung markings with mild bibasilar atelectasis and/or early infiltrate. Electronically Signed   By: Virgina Norfolk  M.D.   On: 02/11/2021 20:41   DG Knee Complete 4 Views Right  Result Date: 02/11/2021 CLINICAL DATA:  Status post trauma. EXAM: RIGHT KNEE - COMPLETE 4+ VIEW COMPARISON:  None. FINDINGS: No evidence of acute fracture or dislocation. Mild medial and lateral tibiofemoral compartment space narrowing is seen with degenerative changes also noted along the distal aspect of the right femur. A moderate to large joint effusion is seen. IMPRESSION: 1. Moderate to large joint effusion without evidence of acute fracture. Electronically Signed   By: Virgina Norfolk M.D.   On: 02/11/2021 19:00   CTA negative for PE, chronic findings noted.  Will admit for ongoing work-up for his hypoxia, trend trops.  Discussed with hospitalist, Dr. Luna Fuse-- will admit for ongoing care.   Larene Pickett, PA-C 02/12/21 0014    Breck Coons, MD 02/12/21 0400

## 2021-02-11 NOTE — ED Provider Notes (Signed)
Edwin Hudson Provider Note   CSN: [id number] Arrival date & time: 02/11/21  1721     History Chief Complaint  Patient presents with  . Fall  . OD    Edwin Hudson is a 65 y.o. male brought to the ED via EMS from home for evaluation of syncopal episode.  History obtained directly from patient.  He is awake and alert, oriented to full name, year, place.  States he does not member what happened.  Reports he was at his ex wife's home looking at closets that he will be working on over the next few days.  He then remembers waking up on the floor.  Reports feeling fine other than a little lightheaded.  Reports pain in the right knee.  States that a few days ago he had a fall and landed on his right knee.  States he has had issues with his knee before.  It has "popped out" several times.  Usually pops back in place but feels like this time it did not.  Has been ambulating on this leg with a cane since.  Associated swelling.  History of left knee replacement recently.  Denies recent illnesses.  Denies chest pain, shortness of breath, nausea, vomiting, abdominal pain, diarrhea.  No headache.  He only takes allopurinol for gout but no other medicines.  Reports daily alcohol use.  He drinks 2-3 mixed alcoholic beverages with vodka every day.  His last drink was yesterday.  Denies issues with withdrawals, previous seizures.  Denies recreational drug use including marijuana.  Specifically denies opioid use.  Per triage note, patient's family heard a noise and when they found patient he was on the floor "unresponsive".  Reportedly EMS gave 1 mg of Narcan with immediate response however patient denied substance use.  HPI     Past Medical History:  Diagnosis Date  . Cancer [facility])    bladder cancer  . Gout     Patient Active Problem List   Diagnosis Date Noted  . Low back pain 12/14/2020  . Primary osteoarthritis of left knee 09/25/2016  . S/P total knee  replacement using cement, left 09/25/2016  . Gout 07/29/2012    Past Surgical History:  Procedure Laterality Date  . COLONOSCOPY    . KNEE SURGERY Right 1990  . TOTAL KNEE ARTHROPLASTY Left 09/25/2016   Procedure: TOTAL KNEE ARTHROPLASTY;  Surgeon: Garald Balding, MD;  Location: [place];  Service: Orthopedics;  Laterality: Left;  block done per Dr. Linna Caprice at [phone]       No family history on file.  Social History   Tobacco Use  . Smoking status: Current Every Day Smoker    Packs/day: 1.00    Years: 40.00    Pack years: 40.00    Types: Cigarettes  . Smokeless tobacco: Never Used  Vaping Use  . Vaping Use: Never used  Substance Use Topics  . Alcohol use: Yes    Alcohol/week: 0.0 standard drinks    Comment: usually on weekends  . Drug use: No    Home Medications Prior to Admission medications   Medication Sig Start Date End Date Taking? Authorizing Provider  allopurinol (ZYLOPRIM) 100 MG tablet Take 200 mg by mouth daily. 02/06/21  Yes [provider]  colchicine 0.6 MG tablet Take 0.6 mg by mouth daily as needed (gout pain).   Yes [provider]  HYDROcodone-acetaminophen (NORCO) 5-325 MG tablet Take 1 tablet by mouth every 4 (four) hours as needed  for moderate pain. 10/25/16  Yes Petrarca, Mike Craze, PA-C  Multiple Vitamins-Minerals (CENTRUM SILVER 50+MEN) TABS Take 1 tablet by mouth daily.   Yes [provider]  naproxen sodium (ALEVE) 220 MG tablet Take 220-440 mg by mouth daily as needed (pain).   Yes [provider]  traMADol (ULTRAM) 50 MG tablet Take 1 tablet (50 mg total) by mouth 2 (two) times daily. Patient taking differently: Take 50 mg by mouth every 6 (six) hours as needed for moderate pain. 11/12/16  Yes Garald Balding, MD    Allergies    Patient has no known allergies.  Review of Systems   Review of Systems  Musculoskeletal: Positive for arthralgias and joint swelling.  Neurological: Positive for syncope and  light-headedness.  All other systems reviewed and are negative.   Physical Exam Updated Vital Signs BP (!) 122/59   Pulse (!) 46   Temp 98.2 F (36.8 C) (Oral)   Resp (!) 6   SpO2 97%   Physical Exam Constitutional:      General: He is not in acute distress.    Appearance: He is well-developed.     Comments: Awake   HENT:     Head: Atraumatic.     Comments: No facial, nasal, scalp bone tenderness. No obvious contusions or skin abrasions.     Nose:     Comments: No intranasal bleeding or rhinorrhea. Septum midline    Mouth/Throat:     Mouth: Mucous membranes are dry.     Comments: No intraoral bleeding or tongue injury. Edentulous. Lips and MM dry  Eyes:     Conjunctiva/sclera: Conjunctivae normal.     Comments: Pupils 2-3 mm round, reactive. Lids normal. EOMs and PERRL intact. No racoon's eyes   Neck:     Comments: C-spine: no midline or paraspinal muscular tenderness. ROM deferred, patient in cervical collar  Cardiovascular:     Rate and Rhythm: Normal rate and regular rhythm.     Pulses:          Radial pulses are 1+ on the right side and 1+ on the left side.       Dorsalis pedis pulses are 1+ on the right side and 1+ on the left side.     Heart sounds: Normal heart sounds, S1 normal and S2 normal.     Comments: No chest wall tenderness, contusions  Pulmonary:     Effort: Pulmonary effort is normal.     Breath sounds: Normal breath sounds. No decreased breath sounds.  Abdominal:     Palpations: Abdomen is soft.     Tenderness: There is no abdominal tenderness.  Musculoskeletal:        General: No deformity.     Right knee: Effusion present. Decreased range of motion.     Comments: Right knee: moderate edema/effusion diffusely. Ecchymosis over patella, non tender.  Slightly decreased flexion. No pain with ROM. Patella appears slightly deviated laterally. Patient able to extend knee against resistance and gravity. Compartments of upper/lower leg soft, non tender. No  calf tenderness.  T-spine: no paraspinal muscular tenderness or midline tenderness.   L-spine: no paraspinal muscular or midline tenderness.   Skin:    General: Skin is warm and dry.     Capillary Refill: Capillary refill takes less than 2 seconds.  Neurological:     Mental Status: He is alert, oriented to person, place, and time and easily aroused.     Comments:  Speech is fluent without obvious dysarthria or  dysphasia. Strength 5/5 with hand grip and ankle F/E.   Sensation to light touch intact in hands and feet.  CN II-XII grossly intact bilaterally.    Mental Status: Patient is awake, alert, oriented to person, place, year. Amnesic to events.  Speech is fluent and clear without dysarthria or aphasia. No signs of neglect.  Cranial Nerves: I not tested II visual fields full bilaterally. PERRL.   III, IV, VI EOMs intact without ptosis V sensation to light touch intact in all 3 divisions of trigeminal nerve bilaterally  VII facial movements symmetric bilaterally VIII hearing intact to voice/conversation  IX, X no uvula deviation, symmetric rise of soft palate/uvula XI 5/5 SCM and trapezius strength bilaterally  XII tongue protrusion midline, symmetric L/R movements  Motor: Strength 5/5 in upper/lower extremities.  Sensation to light touch intact in face, upper/lower extremities. No pronator drift. No leg drop.  Cerebellar: No ataxia with finger to nose.   Psychiatric:        Behavior: Behavior normal. Behavior is cooperative.        Thought Content: Thought content normal.     ED Results / Procedures / Treatments   Labs (all labs ordered are listed, but only abnormal results are displayed) Labs Reviewed  CBC WITH DIFFERENTIAL/PLATELET - Abnormal; Notable for the following components:      Result Value   WBC 12.1 (*)    MCV 102.7 (*)    Neutro Abs 10.7 (*)    Abs Immature Granulocytes 0.08 (*)    All other components within normal limits  COMPREHENSIVE METABOLIC PANEL  - Abnormal; Notable for the following components:   Glucose, Bld 165 (*)    All other components within normal limits  D-DIMER, QUANTITATIVE - Abnormal; Notable for the following components:   D-Dimer, Quant 1.02 (*)    All other components within normal limits  TROPONIN I (HIGH SENSITIVITY) - Abnormal; Notable for the following components:   Troponin I (High Sensitivity) 27 (*)    All other components within normal limits  RESP PANEL BY RT-PCR (FLU A&B, COVID) ARPGX2  ETHANOL  MAGNESIUM  URINALYSIS, ROUTINE W REFLEX MICROSCOPIC  RAPID URINE DRUG SCREEN, HOSP PERFORMED  VITAMIN B1  CBG MONITORING, ED  TROPONIN I (HIGH SENSITIVITY)    EKG EKG Interpretation  Date/Time:  Saturday February 11 2021 20:25:02 EST Ventricular Rate:  78 PR Interval:    QRS Duration: 104 QT Interval:  437 QTC Calculation: 498 R Axis:   101 Text Interpretation: Ectopic atrial rhythm Left posterior fascicular block new Borderline prolonged QT interval Confirmed by Blanchie Dessert [id number]) on 02/11/2021 8:43:10 PM   Radiology CT Head Wo Contrast  Result Date: 02/11/2021 CLINICAL DATA:  Status post fall. EXAM: CT HEAD WITHOUT CONTRAST TECHNIQUE: Contiguous axial images were obtained from the base of the skull through the vertex without intravenous contrast. COMPARISON:  None. FINDINGS: Brain: No evidence of acute infarction, hemorrhage, hydrocephalus, extra-axial collection or mass lesion/mass effect. Vascular: No hyperdense vessel or unexpected calcification. Skull: Normal. Negative for fracture or focal lesion. Sinuses/Orbits: No acute finding. Other: None. IMPRESSION: No acute intracranial pathology. Electronically Signed   By: Virgina Norfolk M.D.   On: 02/11/2021 19:05   CT Cervical Spine Wo Contrast  Result Date: 02/11/2021 CLINICAL DATA:  Status post fall. EXAM: CT CERVICAL SPINE WITHOUT CONTRAST TECHNIQUE: Multidetector CT imaging of the cervical spine was performed without intravenous contrast.  Multiplanar CT image reconstructions were also generated. COMPARISON:  None. FINDINGS: Alignment: Normal. Skull base and vertebrae:  No acute fracture. No primary bone lesion or focal pathologic process. Soft tissues and spinal canal: No prevertebral fluid or swelling. No visible canal hematoma. Disc levels: Marked severity endplate sclerosis is seen at the level of C5-C6. Moderate to marked severity intervertebral disc space narrowing is also seen at the level of C5-C6. Mild bilateral multilevel facet joint hypertrophy is noted. Upper chest: Negative. Other: None. IMPRESSION: 1. Marked severity degenerative changes at the level of C5-C6. 2. No evidence of an acute fracture or subluxation. Electronically Signed   By: Virgina Norfolk M.D.   On: 02/11/2021 19:09   DG Chest Port 1 View  Result Date: 02/11/2021 CLINICAL DATA:  Chest pain. EXAM: PORTABLE CHEST 1 VIEW COMPARISON:  September 18, 2016 FINDINGS: Decreased lung volumes are seen which is likely, in part, secondary to the degree of patient inspiration. Mild, diffuse, chronic appearing increased interstitial lung markings are seen. Very mild areas of atelectasis and/or early infiltrate are noted within the bilateral lung bases. There is no evidence of a pleural effusion or pneumothorax. The cardiac silhouette is borderline in size. A thin, linear, radiopaque wire-like structure is seen overlying the cardiac silhouette, to the right of midline. A subcentimeter radiopaque soft tissue foreign body is again seen overlying the mid left clavicle. Degenerative changes seen throughout the thoracic spine. IMPRESSION: Chronic appearing increased interstitial lung markings with mild bibasilar atelectasis and/or early infiltrate. Electronically Signed   By: Virgina Norfolk M.D.   On: 02/11/2021 20:41   DG Knee Complete 4 Views Right  Result Date: 02/11/2021 CLINICAL DATA:  Status post trauma. EXAM: RIGHT KNEE - COMPLETE 4+ VIEW COMPARISON:  None. FINDINGS: No  evidence of acute fracture or dislocation. Mild medial and lateral tibiofemoral compartment space narrowing is seen with degenerative changes also noted along the distal aspect of the right femur. A moderate to large joint effusion is seen. IMPRESSION: 1. Moderate to large joint effusion without evidence of acute fracture. Electronically Signed   By: Virgina Norfolk M.D.   On: 02/11/2021 19:00    Procedures .Critical Care Performed by: Kinnie Feil, PA-C Authorized by: Kinnie Feil, PA-C   Critical care provider statement:    Critical care time (minutes):  45   Critical care was necessary to treat or prevent imminent or life-threatening deterioration of the following conditions:  Respiratory failure   Critical care was time spent personally by me on the following activities:  Discussions with consultants, evaluation of patient's response to treatment, examination of patient, ordering and performing treatments and interventions, ordering and review of laboratory studies, ordering and review of radiographic studies, pulse oximetry, re-evaluation of patient's condition, obtaining history from patient or surrogate, review of old charts and development of treatment plan with patient or surrogate   I assumed direction of critical care for this patient from another provider in my specialty: no       Medications Ordered in ED Medications  lactated ringers bolus 1,000 mL (1,000 mLs Intravenous New Bag/Given 02/11/21 2151)    ED Course  I have reviewed the triage vital signs and the nursing notes.  Pertinent labs & imaging results that were available during my care of the patient were reviewed by me and considered in my medical decision making (see chart for details).  Clinical Course as of 02/11/21 2302  Sat Feb 11, 2021  1922 DG Knee Complete 4 Views Right 1. Moderate to large joint effusion without evidence of acute fracture. [CG]  1922 CT Cervical Spine Wo  Contrast  IMPRESSION: 1. Marked severity degenerative changes at the level of C5-C6. 2. No evidence of an acute fracture or subluxation. [CG]  1922 CT Head Wo Contrast IMPRESSION: No acute intracranial pathology. [CG]  1923 EKG 12-Lead Sinus rhythm Atrial premature complex Probable left atrial enlargement Right axis deviation [CG]  2011 SpO2(!): 89 % [CG]  2035 Re-evaluated patient. Asleep. Spo2 89%, snoring laying mostly flat. Woke up quickly and Spo2 normalized. Denies Cp SOB. Reports chronic cough from "smokers cough", unchanged. Vaccinated for COVID with booster, no symptoms. Last surgery 2017 left knee replacement. Denies hemoptysis, history of PE/DVT, recent prolonged immobilization. Reports having prescription for pain medicines "percocets or tramadols" by PCP in [place]. Can't remember last time he took them. Cannot remember ex-wife's phone number. Only contact on chart is brother but he states he lives in [place].  [CG]  2047 DG Chest Port 1 View IMPRESSION: Chronic appearing increased interstitial lung markings with mild bibasilar atelectasis and/or early infiltrate.    [CG]  2056 Discussed with EDP. Will add trop, d-dimer.  [CG]  2108 Spoke to ex-wife Tammy and her husband. Patient was at their home. Standing up with crutches. He fell back against wall and crutch was propping him up. They grabbed him so he wouldn't fall and lowered him to the ground. He looked like he was going to lose consciousness but never did. Eyes were open the entire time. "gasping for air".  "drooling". Kind of responding but not all the time. He would occasionally blink. EMS arrived and were giving him air.  They gave him narcan and "popped up". By the time he left patient was up and walking to stretcher on crutches, talking. They rarely see him maybe twice a year. Do not know about social life, ETOH or illicit drug use. Brother Shanon Brow [phone], sister Webb Silversmith [phone], Fortino Sic [phone] [CG]   2202 I spoke to Lanae Boast - friend.  States that he has basically like a brother to the patient.  Denies illicit drug use or alcohol use.  Was told that patient responded with Narcan so he states "he must of gotten into something he is not supposed to". [CG]  2234 EKG 12-Lead Ectopic atrial rhythm Left posterior fascicular block new Borderline prolonged QT interval 498 Confirmed by Blanchie Dessert [phone]) on 02/11/2021 8:43:10 PM [CG]    Clinical Course User Index [CG] Arlean Hopping   MDM Rules/Calculators/A&P                           65 y.o. yo with chief complaint of near syncopal/syncopal episode. Reportedly mentation improved after 1 mg narcan.    Previous medical records available, triage and nursing notes reviewed to obtain more history and assist with MDM  I was unable to look at EMS facesheet/report.  But obtained address and number of 911 caller. Additional information obtained from family members. I called patient's ex-wife who called 911. I called patient's best friend Lanae Boast. See above. No known alcohol or illicit drug use per family/friend. No prescriptions for opioids on PDMP.    Chief complain involves an extensive number of treatment options and is a complaint that carries with it a high risk of complications and morbidity and mortality.    Differential diagnosis:  Opioid OD vs ETOH withdrawal seizure or postictal state most likely given report from EMS. On my exam however patient is tired appearing but fully oriented, protecting his airway. Good historian other than amnesic to  the events.  Ambulated and SpO2 dropped to 82% with taking 8 steps in room, swayed and felt light headed.  No CP. Shared with EDP. On ETCO2 and seizure precautions. Will defer narcan at this time as patient is fully awake, conversational and not appearing to be acutely intoxicated. ?postictal.   ER lab work and imaging ordered by triage RN and me, as above  I have personally visualized and  interpreted ER diagnostic work up including labs and imaging.    Labs reveal - D-dimer 1.02. Again, he has no CP but hypoxic. Recent right knee injury, no other risk factors for PE/DVT.  WBC 12.1. Hemoglobin normal. Electrolytes unremarkable. ETOH undetectable. Respiratory panel negative.  Trop 27. Pending UDS, UA.   Imaging reveals - moderate to large effusion of right knee. Marked DDD in cervical spine. CT head unremarkable. CXR with chronic ILD changes and questionable atelectasis vs early infiltrate in bases.  EKG with PAC, borderline QT prolongation 498.   Medications ordered - patient appears dehydrated, 1 L LR.   2300: Re-evaluated again. On 1 L [place]. Pending CTA. Will need admission for hypoxia.   Final Clinical Impression(s) / ED Diagnoses Final diagnoses:  Hypoxia  Near syncope    Rx / DC Orders ED Discharge Orders    None       Arlean Hopping 02/11/21 2302    Blanchie Dessert, MD 02/12/21 2053

## 2021-02-11 NOTE — ED Notes (Signed)
Pt desated to 83% while ambulating. Pt very unstable while walking

## 2021-02-12 ENCOUNTER — Observation Stay (HOSPITAL_BASED_OUTPATIENT_CLINIC_OR_DEPARTMENT_OTHER): Payer: Medicare Other

## 2021-02-12 ENCOUNTER — Encounter (HOSPITAL_COMMUNITY): Payer: Self-pay | Admitting: Internal Medicine

## 2021-02-12 DIAGNOSIS — J9601 Acute respiratory failure with hypoxia: Secondary | ICD-10-CM | POA: Diagnosis present

## 2021-02-12 DIAGNOSIS — R55 Syncope and collapse: Secondary | ICD-10-CM

## 2021-02-12 DIAGNOSIS — M25461 Effusion, right knee: Secondary | ICD-10-CM | POA: Diagnosis not present

## 2021-02-12 DIAGNOSIS — J439 Emphysema, unspecified: Secondary | ICD-10-CM

## 2021-02-12 DIAGNOSIS — Z720 Tobacco use: Secondary | ICD-10-CM

## 2021-02-12 DIAGNOSIS — Z789 Other specified health status: Secondary | ICD-10-CM

## 2021-02-12 DIAGNOSIS — Z7289 Other problems related to lifestyle: Secondary | ICD-10-CM

## 2021-02-12 DIAGNOSIS — F1090 Alcohol use, unspecified, uncomplicated: Secondary | ICD-10-CM

## 2021-02-12 LAB — CBC
HCT: 42.1 % (ref 39.0–52.0)
Hemoglobin: 13.8 g/dL (ref 13.0–17.0)
MCH: 33.7 pg (ref 26.0–34.0)
MCHC: 32.8 g/dL (ref 30.0–36.0)
MCV: 102.7 fL — ABNORMAL HIGH (ref 80.0–100.0)
Platelets: 184 10*3/uL (ref 150–400)
RBC: 4.1 MIL/uL — ABNORMAL LOW (ref 4.22–5.81)
RDW: 13.6 % (ref 11.5–15.5)
WBC: 9.1 10*3/uL (ref 4.0–10.5)
nRBC: 0 % (ref 0.0–0.2)

## 2021-02-12 LAB — RAPID URINE DRUG SCREEN, HOSP PERFORMED
Amphetamines: NOT DETECTED
Barbiturates: NOT DETECTED
Benzodiazepines: NOT DETECTED
Cocaine: NOT DETECTED
Opiates: NOT DETECTED
Tetrahydrocannabinol: NOT DETECTED

## 2021-02-12 LAB — URINALYSIS, ROUTINE W REFLEX MICROSCOPIC
Bilirubin Urine: NEGATIVE
Glucose, UA: NEGATIVE mg/dL
Hgb urine dipstick: NEGATIVE
Ketones, ur: NEGATIVE mg/dL
Leukocytes,Ua: NEGATIVE
Nitrite: NEGATIVE
Protein, ur: NEGATIVE mg/dL
Specific Gravity, Urine: >1.046 — ABNORMAL HIGH (ref 1.005–1.030)
pH: 5 (ref 5.0–8.0)

## 2021-02-12 LAB — FOLATE: Folate: 15 ng/mL (ref >5.9)

## 2021-02-12 LAB — TROPONIN I (HIGH SENSITIVITY): Troponin I (High Sensitivity): 24 ng/L — ABNORMAL HIGH (ref <18)

## 2021-02-12 LAB — ECHOCARDIOGRAM COMPLETE
Area-P 1/2: 3.6 cm2
S' Lateral: 3.6 cm

## 2021-02-12 LAB — HIV ANTIBODY (ROUTINE TESTING W REFLEX): HIV Screen 4th Generation wRfx: NONREACTIVE

## 2021-02-12 LAB — TSH: TSH: 1.219 u[IU]/mL (ref 0.350–4.500)

## 2021-02-12 LAB — VITAMIN B12: Vitamin B-12: 252 pg/mL (ref 180–914)

## 2021-02-12 LAB — CBG MONITORING, ED: Glucose-Capillary: 92 mg/dL (ref 70–99)

## 2021-02-12 MED ORDER — FOLIC ACID 1 MG PO TABS
1.0000 mg | ORAL_TABLET | Freq: Every day | ORAL | Status: DC
  Administered 2021-02-12 – 2021-02-13 (×2): 1 mg via ORAL
  Filled 2021-02-12 (×2): qty 1

## 2021-02-12 MED ORDER — ENOXAPARIN SODIUM 40 MG/0.4ML ~~LOC~~ SOLN
40.0000 mg | SUBCUTANEOUS | Status: DC
  Administered 2021-02-12 – 2021-02-13 (×2): 40 mg via SUBCUTANEOUS
  Filled 2021-02-12 (×2): qty 0.4

## 2021-02-12 MED ORDER — ALLOPURINOL 100 MG PO TABS
200.0000 mg | ORAL_TABLET | Freq: Every day | ORAL | Status: DC
  Administered 2021-02-12 – 2021-02-13 (×2): 200 mg via ORAL
  Filled 2021-02-12 (×2): qty 2

## 2021-02-12 MED ORDER — NICOTINE 14 MG/24HR TD PT24: 14.0000 mg | MEDICATED_PATCH | Freq: Every day | TRANSDERMAL | Status: DC

## 2021-02-12 MED ORDER — NICOTINE 21 MG/24HR TD PT24
21.0000 mg | MEDICATED_PATCH | Freq: Every day | TRANSDERMAL | Status: DC
  Administered 2021-02-12 – 2021-02-13 (×2): 21 mg via TRANSDERMAL
  Filled 2021-02-12 (×2): qty 1

## 2021-02-12 MED ORDER — ACETAMINOPHEN 650 MG RE SUPP: 650.0000 mg | Freq: Four times a day (QID) | RECTAL | Status: DC | PRN

## 2021-02-12 MED ORDER — ADULT MULTIVITAMIN W/MINERALS CH
1.0000 | ORAL_TABLET | Freq: Every day | ORAL | Status: DC
  Administered 2021-02-12 – 2021-02-13 (×2): 1 via ORAL
  Filled 2021-02-12 (×2): qty 1

## 2021-02-12 MED ORDER — FLUTICASONE FUROATE-VILANTEROL 200-25 MCG/INH IN AEPB
1.0000 | INHALATION_SPRAY | Freq: Every day | RESPIRATORY_TRACT | Status: DC
  Administered 2021-02-13: 1 via RESPIRATORY_TRACT
  Filled 2021-02-12: qty 28

## 2021-02-12 MED ORDER — ONDANSETRON HCL 4 MG/2ML IJ SOLN: 4.0000 mg | Freq: Four times a day (QID) | INTRAMUSCULAR | Status: DC | PRN

## 2021-02-12 MED ORDER — THIAMINE HCL 100 MG PO TABS
100.0000 mg | ORAL_TABLET | Freq: Every day | ORAL | Status: DC
  Administered 2021-02-12 – 2021-02-13 (×2): 100 mg via ORAL
  Filled 2021-02-12 (×2): qty 1

## 2021-02-12 MED ORDER — ONDANSETRON HCL 4 MG PO TABS
4.0000 mg | ORAL_TABLET | Freq: Four times a day (QID) | ORAL | Status: DC | PRN
  Administered 2021-02-12: 4 mg via ORAL
  Filled 2021-02-12: qty 1

## 2021-02-12 MED ORDER — ACETAMINOPHEN 325 MG PO TABS
650.0000 mg | ORAL_TABLET | Freq: Four times a day (QID) | ORAL | Status: DC | PRN
  Administered 2021-02-12 – 2021-02-13 (×2): 650 mg via ORAL
  Filled 2021-02-12 (×2): qty 2

## 2021-02-12 MED ORDER — ALBUTEROL SULFATE (2.5 MG/3ML) 0.083% IN NEBU: 2.5000 mg | INHALATION_SOLUTION | RESPIRATORY_TRACT | Status: DC | PRN

## 2021-02-12 MED ORDER — TRAMADOL HCL 50 MG PO TABS
50.0000 mg | ORAL_TABLET | Freq: Four times a day (QID) | ORAL | Status: DC | PRN
  Administered 2021-02-12 – 2021-02-13 (×2): 50 mg via ORAL
  Filled 2021-02-12 (×2): qty 1

## 2021-02-12 NOTE — Evaluation (Signed)
Physical Therapy Evaluation Patient Details Name: Edwin Hudson MRN: [id number] DOB: 05/25/1956 Today's Date: 02/12/2021   History of Present Illness  Edwin Hudson is a 65 y.o. male with hx of chronic tobacco abuse, OA, COPD, chronic alcohol use, who presented to the ED on 02/11/2021 via EMS from his ex-wife's house secondary to syncopal episode. Found to have acute hypoxic respiratory failure. Currenlty using crutches due to right knee pain that has now "popped back into place and is doing better"    Clinical Impression  Pt with episode of emesis upon sitting up EOB but then felt better and agreeable to ambulation. Pt functioning at supervision but SpO2 does drop to 85% on RA s/p 50' of ambulation. SpO2 at 90-92% on 2Lo2 via [place]. Acute PT to cont to follow to progress mobility. Pt safe to amb with L unilateral crutch to offset R knee pain. Will discuss energy conservation techniques next session despite pt stating "I'm fine, I'm no short of breath, I'm no sure why it drops like that.' Acute PT to cont to follow.    Follow Up Recommendations No PT follow up    Equipment Recommendations   (has crutches)    Recommendations for Other Services       Precautions / Restrictions Precautions Precautions: Fall Restrictions Weight Bearing Restrictions: No      Mobility  Bed Mobility Overal bed mobility: Independent             General bed mobility comments: pt able to get to bed with increased effort, pt then vomited    Transfers Overall transfer level: Modified independent Equipment used: None             General transfer comment: no assist required  Ambulation/Gait Ambulation/Gait assistance: Min guard Gait Distance (Feet): 150 Feet Assistive device: Crutches (L unilateral crutch for R knee pain/problems) Gait Pattern/deviations: Step-through pattern;Antalgic Gait velocity: wfl Gait velocity interpretation: 1.31 - 2.62 ft/sec, indicative of limited community  ambulator General Gait Details: pt with could sequencing of L crutch with walking, SpO2 dec to 85% on RA s/p 50' of amb, SpO2 at 90% on 2Lo2 via [place] while walking  Stairs            Wheelchair Mobility    Modified Rankin (Stroke Patients Only)       Balance Overall balance assessment: Mild deficits observed, not formally tested                                           Pertinent Vitals/Pain      Home Living Family/patient expects to be discharged to:: Private residence Living Arrangements: Alone Available Help at Discharge: Family;Available PRN/intermittently Type of Home: Mobile home Home Access: Stairs to enter Entrance Stairs-Rails:  (uses crutches) Entrance Stairs-Number of Steps: 3 Home Layout: One level Home Equipment: Crutches      Prior Function Level of Independence: Independent               Hand Dominance   Dominant Hand: Right    Extremity/Trunk Assessment   Upper Extremity Assessment Upper Extremity Assessment: Overall WFL for tasks assessed    Lower Extremity Assessment Lower Extremity Assessment: Overall WFL for tasks assessed    Cervical / Trunk Assessment Cervical / Trunk Assessment: Normal  Communication   Communication: No difficulties  Cognition Arousal/Alertness: Awake/alert Behavior During Therapy: WFL for tasks assessed/performed Overall Cognitive  Status: Within Functional Limits for tasks assessed                                        General Comments General comments (skin integrity, edema, etc.): pt with vomiting, RN brought zofran    Exercises     Assessment/Plan    PT Assessment Patient needs continued PT services  PT Problem List Decreased balance;Decreased mobility       PT Treatment Interventions DME instruction;Gait training;Stair training;Functional mobility training;Therapeutic activities;Therapeutic exercise;Balance training    PT Goals (Current goals can be found  in the Care Plan section)  Acute Rehab PT Goals Patient Stated Goal: to go home tomorrow PT Goal Formulation: With patient Time For Goal Achievement: 02/26/21 Potential to Achieve Goals: Good    Frequency Min 3X/week   Barriers to discharge Decreased caregiver support lives alone    Co-evaluation               AM-PAC PT "6 Clicks" Mobility  Outcome Measure Help needed turning from your back to your side while in a flat bed without using bedrails?: None Help needed moving from lying on your back to sitting on the side of a flat bed without using bedrails?: None Help needed moving to and from a bed to a chair (including a wheelchair)?: None Help needed standing up from a chair using your arms (e.g., wheelchair or bedside chair)?: None Help needed to walk in hospital room?: A Little Help needed climbing 3-5 steps with a railing? : A Little 6 Click Score: 22    End of Session   Activity Tolerance: Patient tolerated treatment well Patient left: in bed;with call bell/phone within reach Nurse Communication: Mobility status PT Visit Diagnosis: Difficulty in walking, not elsewhere classified (R26.2)    Time: 0350-0938 PT Time Calculation (min) (ACUTE ONLY): 25 min   Charges:   PT Evaluation $PT Eval Moderate Complexity: 1 Mod PT Treatments $Gait Training: 8-22 mins        Kittie Plater, PT, DPT Acute Rehabilitation Services Pager #: [phone] Office #: [phone]   Berline Lopes 02/12/2021, 3:58 PM

## 2021-02-12 NOTE — ED Notes (Signed)
Breakfast ordered 

## 2021-02-12 NOTE — ED Notes (Signed)
PT at bedside w/ pt. Pt getting up to ambulate and had an episode of emesis. Given PO zofran, reports feeling better

## 2021-02-12 NOTE — ED Notes (Signed)
Attempted to call report x 1  

## 2021-02-12 NOTE — Evaluation (Signed)
Occupational Therapy Evaluation and Discharge Patient Details Name: Edwin Hudson MRN: [id number] DOB: November 21, 1956 Today's Date: 02/12/2021    History of Present Illness Edwin Hudson is a 65 y.o. male with hx of chronic tobacco abuse, OA, COPD, chronic alcohol use, who presented to the ED on 02/11/2021 via EMS from his ex-wife's house secondary to syncopal episode. Found to have acute hypoxic respiratory failure. Currenlty using crutches due to right knee pain that has now "popped back into place and is doing better"   Clinical Impression   This 65 yo male admitted with above presents to acute OT at a Mod I level (RW) for basic ADLs on 4 liters of O2. At rest he was 94% and post ambulating 100 feet he was 92% (both on 4 liters). ADL and associated mobility are not ans issue for him; however he was not on O2 pta so this is a new factor. No further skilled OT needs, we will sign off.    Follow Up Recommendations  No OT follow up    Equipment Recommendations  None recommended by OT       Precautions / Restrictions Precautions Precautions: Fall      Mobility Bed Mobility Overal bed mobility: Independent                  Transfers Overall transfer level: Modified independent Equipment used: Rolling walker (2 wheeled)                  Balance Overall balance assessment: Mild deficits observed, not formally tested                                         ADL either performed or assessed with clinical judgement   ADL Overall ADL's : Modified independent                                             Vision Patient Visual Report: No change from baseline              Pertinent Vitals/Pain Pain Assessment: No/denies pain     Hand Dominance Right   Extremity/Trunk Assessment Upper Extremity Assessment Upper Extremity Assessment: Overall WFL for tasks assessed           Communication Communication Communication: No  difficulties   Cognition Arousal/Alertness: Awake/alert Behavior During Therapy: WFL for tasks assessed/performed Overall Cognitive Status: Within Functional Limits for tasks assessed                                                Home Living Family/patient expects to be discharged to:: Private residence Living Arrangements: Alone Available Help at Discharge: Family;Available PRN/intermittently Type of Home: Mobile home Home Access: Stairs to enter Entrance Stairs-Number of Steps: 3 Entrance Stairs-Rails:  (uses crutches) Home Layout: One level     Bathroom Shower/Tub: Corporate investment banker: Standard     Home Equipment: Crutches          Prior Functioning/Environment Level of Independence: Independent                 OT Problem List: Decreased range  of motion         OT Goals(Current goals can be found in the care plan section) Acute Rehab OT Goals Patient Stated Goal: to go home tomorrow  OT Frequency:                AM-PAC OT "6 Clicks" Daily Activity     Outcome Measure Help from another person eating meals?: None Help from another person taking care of personal grooming?: None Help from another person toileting, which includes using toliet, bedpan, or urinal?: None Help from another person bathing (including washing, rinsing, drying)?: None Help from another person to put on and taking off regular upper body clothing?: None Help from another person to put on and taking off regular lower body clothing?: None 6 Click Score: 24   End of Session Equipment Utilized During Treatment: Gait belt;Rolling walker;Oxygen (4 liters) Nurse Communication: Mobility status (sats 94% on 4 liters at rest and dropped to 92% on 4 liters with acitvity)  Activity Tolerance: Patient tolerated treatment well Patient left: in bed;with call bell/phone within reach  OT Visit Diagnosis: Unsteadiness on feet (R26.81)                 Time: 2633-3545 OT Time Calculation (min): 25 min Charges:  OT General Charges $OT Visit: 1 Visit OT Evaluation $OT Eval Moderate Complexity: 1 Mod OT Treatments $Self Care/Home Management : 8-22 mins  Golden Circle, OTR/L Acute [employer] Pager [phone] Office [phone]    Almon Register 02/12/2021, 10:24 AM

## 2021-02-12 NOTE — Progress Notes (Signed)
PROGRESS NOTE    Edwin Hudson  SEG:[id number] DOB: Feb 28, 1956 DOA: 02/11/2021 PCP: Pcp, No   Brief Narrative:  Patient is 65 year old male with past medical history of chronic tobacco abuse, osteoarthritis, chronic alcohol abuse presents to emergency department for evaluation of syncopal episode.  EMS was called and was given Narcan and apparently responded.  He uses crutches due to right knee pain.  Reported that his knee popped out and has been causing him severe pain and swelling.    ED course: Heart rate: 52, afebrile, blood pressure: 104/75, maintaining oxygen saturation 93% on 3 L via nasal cannula.  WBC: 12.1, CTA chest negative for PE shows emphysematous changes.  CT head negative, right knee x-ray shows moderate to large effusion.  CT cervical spine showed mild severe degenerative changes of C5-C6.  Patient admitted for further evaluation of syncopal episode.  Assessment & Plan:   Syncope: -Unknown etiology?  Hypoxia versus arrhythmias? -Work-up such as CTA chest, CT head negative for acute findings.  CT cervical spine shows severe degenerative changes at C5-C6.  Urine drug: Negative.  Ethanol level: WNL UA negative for infection. -Continue to monitor on telemetry. -We will get transthoracic echo and will check TSH, B12 and folate -Consult PT/OT -On fall precautions  Acute hypoxemic respiratory failure in the setting of underlying COPD: -Patient does not use oxygen at home.  Currently requiring 4 L of oxygen via nasal cannula.  No wheezing noted on exam.  -CTA chest negative for PE shows emphysematous changes. -We will try to wean off of oxygen as tolerated. -Continue Breo Ellipta and albuterol as needed  Right knee pain/swelling: -X-ray shows moderate to large effusion -Patient reports improvement in pain and able to flex/extend without any issues. -Continue as needed pain medication.    -Has a scheduled appointment with orthopedic ([facility]) on 2/22  Tobacco  abuse:  -Smokes 1-1/2 packs of cigarettes per day.   -Continue nicotine patch -Counseled about cessation  Alcohol abuse: Drinks 3 alcoholic drinks per day -Ethanol level: WNL -Start patient on CIWA protocol. -Continue folate, thiamine and multivitamin daily  Leukocytosis: WBC 12.1 -Likely reactive.  UA negative, chest x-ray negative for acute infection, COVID-19 negative.  Repeat CBC tomorrow.  No indication of antibiotics at this time  Macrocytosis: MCV: 102.7 -Likely in the setting of alcohol abuse.  Check TSH, B12, folate level  Marijuana use: -Uses marijuana now and then.  Counseled about cessation  DVT prophylaxis: Lovenox Code Status: Full code Family Communication:  None present at bedside.  Plan of care discussed with patient in length and he verbalized understanding and agreed with it. Disposition Plan: Likely home in 1 to 2 days  Consultants:   None  Procedures:   None  Antimicrobials:   None  Status is: Observation   Dispo: The patient is from: Home              Anticipated d/c is to: Home              Anticipated d/c date is: 1 day              Patient currently is not medically stable to d/c.   Difficult to place patient No         Subjective: Patient seen and examined in ED.  Resting comfortably on the bed.  Tells me that he continues to feel lightheaded and dizzy.  Currently requiring 4 L of oxygen via nasal cannula.  Does not use oxygen at home.  Reports that right knee pain and swelling has improved and has a scheduled appointment with orthopedic surgery on Tuesday. Denies chest pain, shortness of breath, palpitation, leg swelling, nausea, vomiting, fever or chills.  Smokes 1 and half pack of cigarettes per day, drinks 3 alcoholic drinks per day and uses marijuana now and then.  Objective: Vitals:   02/12/21 0730 02/12/21 0745 02/12/21 0800 02/12/21 0815  BP: 112/65 (!) 116/58 110/65 122/71  Pulse: (!) 58 (!) 59 (!) 59 73  Resp: (!) 9 14  14  (!) 25  Temp:      TempSrc:      SpO2: 91% 92% 92% 94%   No intake or output data in the 24 hours ending 02/12/21 0859 There were no vitals filed for this visit.  Examination:  General exam: Appears calm and comfortable, on 4 L of oxygen via nasal cannula, communicating well, thin and lean, appears older than actual age Respiratory system: Clear to auscultation. Respiratory effort normal.  No wheezing, rhonchi or crackles Cardiovascular system: S1 & S2 heard, RRR. No JVD, murmurs, rubs, gallops or clicks. No pedal edema. Gastrointestinal system: Abdomen is nondistended, soft and nontender. No organomegaly or masses felt. Normal bowel sounds heard. Central nervous system: Alert and oriented. No focal neurological deficits. Extremities: Right knee swollen, no redness or tenderness noted.  Able to flex and extend the knee without any issues. Skin: No rashes, lesions or ulcers Psychiatry: Judgement and insight appear normal. Mood & affect appropriate.    Data Reviewed: I have personally reviewed following labs and imaging studies  CBC: Recent Labs  Lab 02/11/21 1950 02/12/21 0401  WBC 12.1* 9.1  NEUTROABS 10.7*  --   HGB 15.0 13.8  HCT 46.2 42.1  MCV 102.7* 102.7*  PLT 188 924   Basic Metabolic Panel: Recent Labs  Lab 02/11/21 1950  NA 138  K 3.8  CL 105  CO2 24  GLUCOSE 165*  BUN 10  CREATININE 0.91  CALCIUM 9.3  MG 2.0   GFR: CrCl cannot be calculated (Unknown ideal weight.). Liver Function Tests: Recent Labs  Lab 02/11/21 1950  AST 28  ALT 23  ALKPHOS 65  BILITOT 0.6  PROT 7.1  ALBUMIN 3.8   No results for input(s): LIPASE, AMYLASE in the last 168 hours. No results for input(s): AMMONIA in the last 168 hours. Coagulation Profile: No results for input(s): INR, PROTIME in the last 168 hours. Cardiac Enzymes: No results for input(s): CKTOTAL, CKMB, CKMBINDEX, TROPONINI in the last 168 hours. BNP (last 3 results) No results for input(s): PROBNP in the  last 8760 hours. HbA1C: No results for input(s): HGBA1C in the last 72 hours. CBG: Recent Labs  Lab 02/12/21 0004  GLUCAP 92   Lipid Profile: No results for input(s): CHOL, HDL, LDLCALC, TRIG, CHOLHDL, LDLDIRECT in the last 72 hours. Thyroid Function Tests: No results for input(s): TSH, T4TOTAL, FREET4, T3FREE, THYROIDAB in the last 72 hours. Anemia Panel: No results for input(s): VITAMINB12, FOLATE, FERRITIN, TIBC, IRON, RETICCTPCT in the last 72 hours. Sepsis Labs: No results for input(s): PROCALCITON, LATICACIDVEN in the last 168 hours.  Recent Results (from the past 240 hour(s))  Resp Panel by RT-PCR (Flu A&B, Covid) Nasopharyngeal Swab     Status: None   Collection Time: 02/11/21  7:01 PM   Specimen: Nasopharyngeal Swab; Nasopharyngeal(NP) swabs in vial transport medium  Result Value Ref Range Status   SARS Coronavirus 2 by RT PCR NEGATIVE NEGATIVE Final    Comment: (NOTE) SARS-CoV-2 target nucleic  acids are NOT DETECTED.  The SARS-CoV-2 RNA is generally detectable in upper respiratory specimens during the acute phase of infection. The lowest concentration of SARS-CoV-2 viral copies this assay can detect is 138 copies/mL. A negative result does not preclude SARS-Cov-2 infection and should not be used as the sole basis for treatment or other patient management decisions. A negative result may occur with  improper specimen collection/handling, submission of specimen other than nasopharyngeal swab, presence of viral mutation(s) within the areas targeted by this assay, and inadequate number of viral copies(<138 copies/mL). A negative result must be combined with clinical observations, patient history, and epidemiological information. The expected result is Negative.  Fact Sheet for Patients:  EntrepreneurPulse.com.au  Fact Sheet for Healthcare Providers:  IncredibleEmployment.be  This test is no t yet approved or cleared by the [place] FDA and  has been authorized for detection and/or diagnosis of SARS-CoV-2 by FDA under an Emergency Use Authorization (EUA). This EUA will remain  in effect (meaning this test can be used) for the duration of the COVID-19 declaration under Section 564(b)(1) of the Act, 21 U.S.C.section 360bbb-3(b)(1), unless the authorization is terminated  or revoked sooner.       Influenza A by PCR NEGATIVE NEGATIVE Final   Influenza B by PCR NEGATIVE NEGATIVE Final    Comment: (NOTE) The Xpert Xpress SARS-CoV-2/FLU/RSV plus assay is intended as an aid in the diagnosis of influenza from Nasopharyngeal swab specimens and should not be used as a sole basis for treatment. Nasal washings and aspirates are unacceptable for Xpert Xpress SARS-CoV-2/FLU/RSV testing.  Fact Sheet for Patients: EntrepreneurPulse.com.au  Fact Sheet for Healthcare Providers: IncredibleEmployment.be  This test is not yet approved or cleared by the [place] FDA and has been authorized for detection and/or diagnosis of SARS-CoV-2 by FDA under an Emergency Use Authorization (EUA). This EUA will remain in effect (meaning this test can be used) for the duration of the COVID-19 declaration under Section 564(b)(1) of the Act, 21 U.S.C. section 360bbb-3(b)(1), unless the authorization is terminated or revoked.  Performed at [facility], [place] [address] [place], [place] [address]       Radiology Studies: CT Head Wo Contrast  Result Date: 02/11/2021 CLINICAL DATA:  Status post fall. EXAM: CT HEAD WITHOUT CONTRAST TECHNIQUE: Contiguous axial images were obtained from the base of the skull through the vertex without intravenous contrast. COMPARISON:  None. FINDINGS: Brain: No evidence of acute infarction, hemorrhage, hydrocephalus, extra-axial collection or mass lesion/mass effect. Vascular: No hyperdense vessel or unexpected calcification. Skull: Normal. Negative for fracture  or focal lesion. Sinuses/Orbits: No acute finding. Other: None. IMPRESSION: No acute intracranial pathology. Electronically Signed   By: Virgina Norfolk M.D.   On: 02/11/2021 19:05   CT Angio Chest PE W and/or Wo Contrast  Result Date: 02/11/2021 CLINICAL DATA:  Status post fall. EXAM: CT ANGIOGRAPHY CHEST WITH CONTRAST TECHNIQUE: Multidetector CT imaging of the chest was performed using the standard protocol during bolus administration of intravenous contrast. Multiplanar CT image reconstructions and MIPs were obtained to evaluate the vascular anatomy. CONTRAST:  36mL OMNIPAQUE IOHEXOL 350 MG/ML SOLN COMPARISON:  None. FINDINGS: Cardiovascular: There is mild calcification and atherosclerosis of the aortic arch. Satisfactory opacification of the pulmonary arteries to the segmental level. No evidence of pulmonary embolism. Normal heart size with mild coronary artery calcification. No pericardial effusion. Mediastinum/Nodes: No enlarged mediastinal, hilar, or axillary lymph nodes. Thyroid gland, trachea, and esophagus demonstrate no significant findings. Lungs/Pleura: Mild emphysematous lung disease is  seen involving the bilateral upper lobes, right greater than left. Moderate severity fibrotic changes and honeycombing he is also noted within the posterior aspects of the bilateral lower lobes. Mild atelectasis is also seen within the bilateral lung bases. There is no evidence of a pleural effusion or pneumothorax. Upper Abdomen: No acute abnormality. Musculoskeletal: Multilevel degenerative changes seen throughout the thoracic spine. Review of the MIP images confirms the above findings. IMPRESSION: 1. No evidence of pulmonary embolism. 2. Moderate severity bilateral lower lobe fibrotic changes with mild bibasilar atelectasis. 3. Emphysema and aortic atherosclerosis. Aortic Atherosclerosis (ICD10-I70.0) and Emphysema (ICD10-J43.9). Electronically Signed   By: Virgina Norfolk M.D.   On: 02/11/2021 23:37   CT  Cervical Spine Wo Contrast  Result Date: 02/11/2021 CLINICAL DATA:  Status post fall. EXAM: CT CERVICAL SPINE WITHOUT CONTRAST TECHNIQUE: Multidetector CT imaging of the cervical spine was performed without intravenous contrast. Multiplanar CT image reconstructions were also generated. COMPARISON:  None. FINDINGS: Alignment: Normal. Skull base and vertebrae: No acute fracture. No primary bone lesion or focal pathologic process. Soft tissues and spinal canal: No prevertebral fluid or swelling. No visible canal hematoma. Disc levels: Marked severity endplate sclerosis is seen at the level of C5-C6. Moderate to marked severity intervertebral disc space narrowing is also seen at the level of C5-C6. Mild bilateral multilevel facet joint hypertrophy is noted. Upper chest: Negative. Other: None. IMPRESSION: 1. Marked severity degenerative changes at the level of C5-C6. 2. No evidence of an acute fracture or subluxation. Electronically Signed   By: Virgina Norfolk M.D.   On: 02/11/2021 19:09   DG Chest Port 1 View  Result Date: 02/11/2021 CLINICAL DATA:  Chest pain. EXAM: PORTABLE CHEST 1 VIEW COMPARISON:  September 18, 2016 FINDINGS: Decreased lung volumes are seen which is likely, in part, secondary to the degree of patient inspiration. Mild, diffuse, chronic appearing increased interstitial lung markings are seen. Very mild areas of atelectasis and/or early infiltrate are noted within the bilateral lung bases. There is no evidence of a pleural effusion or pneumothorax. The cardiac silhouette is borderline in size. A thin, linear, radiopaque wire-like structure is seen overlying the cardiac silhouette, to the right of midline. A subcentimeter radiopaque soft tissue foreign body is again seen overlying the mid left clavicle. Degenerative changes seen throughout the thoracic spine. IMPRESSION: Chronic appearing increased interstitial lung markings with mild bibasilar atelectasis and/or early infiltrate.  Electronically Signed   By: Virgina Norfolk M.D.   On: 02/11/2021 20:41   DG Knee Complete 4 Views Right  Result Date: 02/11/2021 CLINICAL DATA:  Status post trauma. EXAM: RIGHT KNEE - COMPLETE 4+ VIEW COMPARISON:  None. FINDINGS: No evidence of acute fracture or dislocation. Mild medial and lateral tibiofemoral compartment space narrowing is seen with degenerative changes also noted along the distal aspect of the right femur. A moderate to large joint effusion is seen. IMPRESSION: 1. Moderate to large joint effusion without evidence of acute fracture. Electronically Signed   By: Virgina Norfolk M.D.   On: 02/11/2021 19:00    Scheduled Meds: . allopurinol  200 mg Oral Daily  . enoxaparin (LOVENOX) injection  40 mg Subcutaneous Q24H  . fluticasone furoate-vilanterol  1 puff Inhalation Daily  . folic acid  1 mg Oral Daily  . multivitamin with minerals  1 tablet Oral Daily  . nicotine  21 mg Transdermal Daily  . thiamine  100 mg Oral Daily   Continuous Infusions:   LOS: 0 days   Time spent: 35 minutes  Mckinley Jewel, MD [facility]  If 7PM-7AM, please contact night-coverage [email or web address] 02/12/2021, 8:59 AM

## 2021-02-12 NOTE — Progress Notes (Signed)
*  PRELIMINARY RESULTS* Echocardiogram 2D Echocardiogram has been performed.  Samuel Germany 02/12/2021, 2:42 PM

## 2021-02-12 NOTE — ED Notes (Signed)
Lunch Tray Ordered @ 1016. 

## 2021-02-12 NOTE — H&P (Signed)
History and Physical  Patient Name: Edwin Hudson     GUY:[id number]    DOB: 1956-11-08    DOA: 02/11/2021 PCP: Pcp, No  Patient coming from: Ex-wife's house  Chief Complaint: Possible syncopal episode   HPI: Edwin Hudson is a 65 y.o. male with hx of chronic tobacco abuse, OA, chronic alcohol use, who presented to the ED on 02/11/2021 via EMS from his ex-wife's house secondary to syncopal episode.  At the time my exam, patient is alert and oriented, no acute distress, resting comfortably.  Currently he was at his ex-wife's house, looking at closets that he plans on working with when he started to get lightheaded and then does not remember what happened next.  Currently he was standing on his crutches and fell back against a wall and there is able to catch him and lowered him to the ground without hurting himself.  He said he never lost consciousness but did look dyspneic.  EMS was called and they gave him Narcan and apparently responded.  ER provider reached out to ex-wife and friend however they denied history of substance abuse.  Patient has been on crutches due to right knee pain.  He said on Thursday his knee "popped out" and has been causing him to have a significant amount of pain.  He has a history of left knee replacement and says his right knee he had a scoped previously.  He said he was scheduled to see his orthopedic doctor next week but now his knee is "in place" and doing better.  ED course: -Vitals initially on presentation: Heart rate 52, afebrile, respiratory rate 16, blood pressure 104/75, satting 93% on 3 L nasal cannula. -Relevant labs on initial presentation: Sodium 138, potassium 3.8, bicarb 24, glucose 165, BUN 10, creatinine 0.91, troponin 27, WBC 12.1, hemoglobin 15, alcohol negative, Covid negative -CTA of chest showed no evidence of PE, emphysematous changes.  CT head unremarkable.  Right knee x-ray showed moderate to large effusion.  CT cervical spine showed marked  severe degenerative changes of C5-C6. -Patient was ambulated and desatted quickly to the low 80s on room air -The hospitalist service was contacted for further evaluation and management.     ROS: A complete and thorough review of systems obtained, negative as stated in the HPI.    Past Medical History:  Diagnosis Date  . Cancer [facility])    bladder cancer  . Gout     Past Surgical History:  Procedure Laterality Date  . COLONOSCOPY    . KNEE SURGERY Right 1990  . TOTAL KNEE ARTHROPLASTY Left 09/25/2016   Procedure: TOTAL KNEE ARTHROPLASTY;  Surgeon: Garald Balding, MD;  Location: [facility];  Service: Orthopedics;  Laterality: Left;  block done per Dr. Linna Caprice at [phone]    Social History: Patient lives at home, alone.  The patient walks without assistance.  Current smoker.  No Known Allergies  Family history: family history is not on file.  Prior to Admission medications   Medication Sig Start Date End Date Taking? Authorizing Provider  allopurinol (ZYLOPRIM) 100 MG tablet Take 200 mg by mouth daily. 02/06/21  Yes [provider]  colchicine 0.6 MG tablet Take 0.6 mg by mouth daily as needed (gout pain).   Yes [provider]  HYDROcodone-acetaminophen (NORCO) 5-325 MG tablet Take 1 tablet by mouth every 4 (four) hours as needed for moderate pain. 10/25/16  Yes Petrarca, Mike Craze, PA-C  Multiple Vitamins-Minerals (CENTRUM SILVER 50+MEN) TABS Take 1 tablet by  mouth daily.   Yes [provider]  naproxen sodium (ALEVE) 220 MG tablet Take 220-440 mg by mouth daily as needed (pain).   Yes [provider]  traMADol (ULTRAM) 50 MG tablet Take 1 tablet (50 mg total) by mouth 2 (two) times daily. Patient taking differently: Take 50 mg by mouth every 6 (six) hours as needed for moderate pain. 11/12/16  Yes Garald Balding, MD       Physical Exam: BP 124/68   Pulse 80   Temp 98.2 F (36.8 C) (Oral)   Resp 13   SpO2 97%  General appearance:  Well-developed, adult male, alert and in no acute distress .   Eyes: Anicteric, conjunctiva pink, lids and lashes normal. PERRL.    ENT: No nasal deformity, discharge, epistaxis.  Hearing intact. OP moist without lesions.   Neck: No neck masses.  Trachea midline.  No thyromegaly/tenderness. Lymph: No cervical or supraclavicular lymphadenopathy. Skin: Warm and dry.  No jaundice.  No suspicious rashes or lesions. Cardiac: RRR, nl S1-S2, no murmurs appreciated.  Capillary refill is brisk. No LE edema.  Radial and pedal pulses 2+ and symmetric. Respiratory: On nasal cannula, diminished breath sounds bilaterally, prolonged expiratory phase, no significant wheezing noted Abdomen: Abdomen soft.  Nontender to palpation, bowel sounds present. No ascites, distension, hepatosplenomegaly.   MSK: Moderate right knee effusion, no erythema, nontender to palpation, nonerythemic Neuro: Cranial nerves 2 through 12 grossly intact.  Sensation intact to light touch. Speech is fluent.  Muscle strength intact.   Psych: Sensorium intact and responding to questions, attention normal.  Behavior appropriate.  Judgment and insight appear normal.     Labs on Admission:  I have personally reviewed following labs and imaging studies: CBC: Recent Labs  Lab 02/11/21 1950  WBC 12.1*  NEUTROABS 10.7*  HGB 15.0  HCT 46.2  MCV 102.7*  PLT 341   Basic Metabolic Panel: Recent Labs  Lab 02/11/21 1950  NA 138  K 3.8  CL 105  CO2 24  GLUCOSE 165*  BUN 10  CREATININE 0.91  CALCIUM 9.3  MG 2.0   GFR: CrCl cannot be calculated (Unknown ideal weight.).  Liver Function Tests: Recent Labs  Lab 02/11/21 1950  AST 28  ALT 23  ALKPHOS 65  BILITOT 0.6  PROT 7.1  ALBUMIN 3.8   No results for input(s): LIPASE, AMYLASE in the last 168 hours. No results for input(s): AMMONIA in the last 168 hours. Coagulation Profile: No results for input(s): INR, PROTIME in the last 168 hours. Cardiac Enzymes: No results for  input(s): CKTOTAL, CKMB, CKMBINDEX, TROPONINI in the last 168 hours. BNP (last 3 results) No results for input(s): PROBNP in the last 8760 hours. HbA1C: No results for input(s): HGBA1C in the last 72 hours. CBG: Recent Labs  Lab 02/12/21 0004  GLUCAP 92   Lipid Profile: No results for input(s): CHOL, HDL, LDLCALC, TRIG, CHOLHDL, LDLDIRECT in the last 72 hours. Thyroid Function Tests: No results for input(s): TSH, T4TOTAL, FREET4, T3FREE, THYROIDAB in the last 72 hours. Anemia Panel: No results for input(s): VITAMINB12, FOLATE, FERRITIN, TIBC, IRON, RETICCTPCT in the last 72 hours.   Recent Results (from the past 240 hour(s))  Resp Panel by RT-PCR (Flu A&B, Covid) Nasopharyngeal Swab     Status: None   Collection Time: 02/11/21  7:01 PM   Specimen: Nasopharyngeal Swab; Nasopharyngeal(NP) swabs in vial transport medium  Result Value Ref Range Status   SARS Coronavirus 2 by RT PCR NEGATIVE NEGATIVE Final  Comment: (NOTE) SARS-CoV-2 target nucleic acids are NOT DETECTED.  The SARS-CoV-2 RNA is generally detectable in upper respiratory specimens during the acute phase of infection. The lowest concentration of SARS-CoV-2 viral copies this assay can detect is 138 copies/mL. A negative result does not preclude SARS-Cov-2 infection and should not be used as the sole basis for treatment or other patient management decisions. A negative result may occur with  improper specimen collection/handling, submission of specimen other than nasopharyngeal swab, presence of viral mutation(s) within the areas targeted by this assay, and inadequate number of viral copies(<138 copies/mL). A negative result must be combined with clinical observations, patient history, and epidemiological information. The expected result is Negative.  Fact Sheet for Patients:  EntrepreneurPulse.com.au  Fact Sheet for Healthcare Providers:  IncredibleEmployment.be  This test is  no t yet approved or cleared by the [place] FDA and  has been authorized for detection and/or diagnosis of SARS-CoV-2 by FDA under an Emergency Use Authorization (EUA). This EUA will remain  in effect (meaning this test can be used) for the duration of the COVID-19 declaration under Section 564(b)(1) of the Act, 21 U.S.C.section 360bbb-3(b)(1), unless the authorization is terminated  or revoked sooner.       Influenza A by PCR NEGATIVE NEGATIVE Final   Influenza B by PCR NEGATIVE NEGATIVE Final    Comment: (NOTE) The Xpert Xpress SARS-CoV-2/FLU/RSV plus assay is intended as an aid in the diagnosis of influenza from Nasopharyngeal swab specimens and should not be used as a sole basis for treatment. Nasal washings and aspirates are unacceptable for Xpert Xpress SARS-CoV-2/FLU/RSV testing.  Fact Sheet for Patients: EntrepreneurPulse.com.au  Fact Sheet for Healthcare Providers: IncredibleEmployment.be  This test is not yet approved or cleared by the [place] FDA and has been authorized for detection and/or diagnosis of SARS-CoV-2 by FDA under an Emergency Use Authorization (EUA). This EUA will remain in effect (meaning this test can be used) for the duration of the COVID-19 declaration under Section 564(b)(1) of the Act, 21 U.S.C. section 360bbb-3(b)(1), unless the authorization is terminated or revoked.  Performed at [facility], [place] [address], [place], [place] [address]            Radiological Exams on Admission: Personally reviewed imaging on admission, CTA of chest showed no evidence of PE, emphysematous changes.  CT head unremarkable.  Right knee x-ray showed moderate to large effusion.  CT cervical spine showed marked severe degenerative changes of C5-C6.  CT Head Wo Contrast  Result Date: 02/11/2021 CLINICAL DATA:  Status post fall. EXAM: CT HEAD WITHOUT CONTRAST TECHNIQUE: Contiguous axial images were obtained  from the base of the skull through the vertex without intravenous contrast. COMPARISON:  None. FINDINGS: Brain: No evidence of acute infarction, hemorrhage, hydrocephalus, extra-axial collection or mass lesion/mass effect. Vascular: No hyperdense vessel or unexpected calcification. Skull: Normal. Negative for fracture or focal lesion. Sinuses/Orbits: No acute finding. Other: None. IMPRESSION: No acute intracranial pathology. Electronically Signed   By: Virgina Norfolk M.D.   On: 02/11/2021 19:05   CT Angio Chest PE W and/or Wo Contrast  Result Date: 02/11/2021 CLINICAL DATA:  Status post fall. EXAM: CT ANGIOGRAPHY CHEST WITH CONTRAST TECHNIQUE: Multidetector CT imaging of the chest was performed using the standard protocol during bolus administration of intravenous contrast. Multiplanar CT image reconstructions and MIPs were obtained to evaluate the vascular anatomy. CONTRAST:  37mL OMNIPAQUE IOHEXOL 350 MG/ML SOLN COMPARISON:  None. FINDINGS: Cardiovascular: There is mild calcification and atherosclerosis of the  aortic arch. Satisfactory opacification of the pulmonary arteries to the segmental level. No evidence of pulmonary embolism. Normal heart size with mild coronary artery calcification. No pericardial effusion. Mediastinum/Nodes: No enlarged mediastinal, hilar, or axillary lymph nodes. Thyroid gland, trachea, and esophagus demonstrate no significant findings. Lungs/Pleura: Mild emphysematous lung disease is seen involving the bilateral upper lobes, right greater than left. Moderate severity fibrotic changes and honeycombing he is also noted within the posterior aspects of the bilateral lower lobes. Mild atelectasis is also seen within the bilateral lung bases. There is no evidence of a pleural effusion or pneumothorax. Upper Abdomen: No acute abnormality. Musculoskeletal: Multilevel degenerative changes seen throughout the thoracic spine. Review of the MIP images confirms the above findings. IMPRESSION:  1. No evidence of pulmonary embolism. 2. Moderate severity bilateral lower lobe fibrotic changes with mild bibasilar atelectasis. 3. Emphysema and aortic atherosclerosis. Aortic Atherosclerosis (ICD10-I70.0) and Emphysema (ICD10-J43.9). Electronically Signed   By: Virgina Norfolk M.D.   On: 02/11/2021 23:37   CT Cervical Spine Wo Contrast  Result Date: 02/11/2021 CLINICAL DATA:  Status post fall. EXAM: CT CERVICAL SPINE WITHOUT CONTRAST TECHNIQUE: Multidetector CT imaging of the cervical spine was performed without intravenous contrast. Multiplanar CT image reconstructions were also generated. COMPARISON:  None. FINDINGS: Alignment: Normal. Skull base and vertebrae: No acute fracture. No primary bone lesion or focal pathologic process. Soft tissues and spinal canal: No prevertebral fluid or swelling. No visible canal hematoma. Disc levels: Marked severity endplate sclerosis is seen at the level of C5-C6. Moderate to marked severity intervertebral disc space narrowing is also seen at the level of C5-C6. Mild bilateral multilevel facet joint hypertrophy is noted. Upper chest: Negative. Other: None. IMPRESSION: 1. Marked severity degenerative changes at the level of C5-C6. 2. No evidence of an acute fracture or subluxation. Electronically Signed   By: Virgina Norfolk M.D.   On: 02/11/2021 19:09   DG Chest Port 1 View  Result Date: 02/11/2021 CLINICAL DATA:  Chest pain. EXAM: PORTABLE CHEST 1 VIEW COMPARISON:  September 18, 2016 FINDINGS: Decreased lung volumes are seen which is likely, in part, secondary to the degree of patient inspiration. Mild, diffuse, chronic appearing increased interstitial lung markings are seen. Very mild areas of atelectasis and/or early infiltrate are noted within the bilateral lung bases. There is no evidence of a pleural effusion or pneumothorax. The cardiac silhouette is borderline in size. A thin, linear, radiopaque wire-like structure is seen overlying the cardiac silhouette,  to the right of midline. A subcentimeter radiopaque soft tissue foreign body is again seen overlying the mid left clavicle. Degenerative changes seen throughout the thoracic spine. IMPRESSION: Chronic appearing increased interstitial lung markings with mild bibasilar atelectasis and/or early infiltrate. Electronically Signed   By: Virgina Norfolk M.D.   On: 02/11/2021 20:41   DG Knee Complete 4 Views Right  Result Date: 02/11/2021 CLINICAL DATA:  Status post trauma. EXAM: RIGHT KNEE - COMPLETE 4+ VIEW COMPARISON:  None. FINDINGS: No evidence of acute fracture or dislocation. Mild medial and lateral tibiofemoral compartment space narrowing is seen with degenerative changes also noted along the distal aspect of the right femur. A moderate to large joint effusion is seen. IMPRESSION: 1. Moderate to large joint effusion without evidence of acute fracture. Electronically Signed   By: Virgina Norfolk M.D.   On: 02/11/2021 19:00       Assessment/Plan   1.  Questionable syncopal-like episode -Possibly related to hypoxia and/or narcotic use -EMS describes improvement in mentation with Narcan -Urine drug  screen pending -CTA of chest was negative for PE -Trend troponin -Telemetry -PT and OT consulted  2.  Acute hypoxic respiratory failure -Patient was hypoxic on room air and with ambulation -Likely secondary to COPD -CTA of chest was negative for PE, but did show emphysematous changes -Started on [place] daily -Breathing treatments as needed as well -Wean O2 as tolerated  3.  COPD -Imaging reviewed, consistent with emphysematous changes -Likely secondary to significant tobacco use history -Not on inhalers or oxygen at home -Started on [place] daily -Breathing treatments as needed as well  4.  Tobacco abuse -Patient currently smokes 1 1/2 packs daily -Recommend cessation -Nicotine patch ordered while inpatient  5.  Daily alcohol use -Patient drinks approximately 3 alcoholic drinks  daily -Monitor for signs of withdrawal.  No history of alcohol withdrawal symptoms -Started on folate, thiamine, multivitamin daily  6.  Right knee effusion -Patient describes his knee "popping out", but now says is fine -X-ray right knee shows moderate to large pleural effusion -No signs of septic knee on exam -Fall precautions -PT and OT consulted -Defer orthopedic consult to day team     DVT prophylaxis: Lovenox Code Status: Full Family Communication: None Disposition Plan: Anticipate discharge home when stable Consults called: None Admission status: Observation   At the point of initial evaluation, it is my clinical opinion that admission for OBSERVATION is reasonable and necessary because the patient's presenting complaints in the context of their chronic conditions represent sufficient risk of deterioration or significant morbidity to constitute reasonable grounds for close observation in the hospital setting, but that the patient may be medically stable for discharge from the hospital within 24 to 48 hours.    Medical decision making: Patient seen at 12:21 AM on 02/12/2021.  The patient was discussed with ER provider.  What exists of the patient's chart was reviewed in depth and summarized above.  Clinical condition: Fair.        Doran Heater [facility] Please page though [place] or Epic secure chat:  For password, contact charge nurse

## 2021-02-13 DIAGNOSIS — J439 Emphysema, unspecified: Secondary | ICD-10-CM | POA: Diagnosis not present

## 2021-02-13 DIAGNOSIS — M25461 Effusion, right knee: Secondary | ICD-10-CM | POA: Diagnosis not present

## 2021-02-13 DIAGNOSIS — Z720 Tobacco use: Secondary | ICD-10-CM

## 2021-02-13 DIAGNOSIS — J9601 Acute respiratory failure with hypoxia: Secondary | ICD-10-CM | POA: Diagnosis not present

## 2021-02-13 DIAGNOSIS — Z7289 Other problems related to lifestyle: Secondary | ICD-10-CM | POA: Diagnosis not present

## 2021-02-13 DIAGNOSIS — R55 Syncope and collapse: Secondary | ICD-10-CM

## 2021-02-13 LAB — CBC
HCT: 38.2 % — ABNORMAL LOW (ref 39.0–52.0)
Hemoglobin: 13.4 g/dL (ref 13.0–17.0)
MCH: 34.8 pg — ABNORMAL HIGH (ref 26.0–34.0)
MCHC: 35.1 g/dL (ref 30.0–36.0)
MCV: 99.2 fL (ref 80.0–100.0)
Platelets: 175 10*3/uL (ref 150–400)
RBC: 3.85 MIL/uL — ABNORMAL LOW (ref 4.22–5.81)
RDW: 13.1 % (ref 11.5–15.5)
WBC: 8.3 10*3/uL (ref 4.0–10.5)
nRBC: 0 % (ref 0.0–0.2)

## 2021-02-13 MED ORDER — THIAMINE HCL 100 MG PO TABS: 100.0000 mg | ORAL_TABLET | Freq: Every day | ORAL | 0 refills | Status: DC

## 2021-02-13 MED ORDER — FOLIC ACID 1 MG PO TABS: 1.0000 mg | ORAL_TABLET | Freq: Every day | ORAL | 0 refills | Status: DC

## 2021-02-13 NOTE — Discharge Summary (Signed)
Physician Discharge Summary  Edwin Hudson:[id number] DOB: 1956/05/20 DOA: 02/11/2021  PCP: Pcp, No  Admit date: 02/11/2021 Discharge date: 02/13/2021  Admitted From: Home Disposition:  Home  Recommendations for Outpatient Follow-up:  Follow-up with PCP in 1 week Follow-up with orthopedic surgery as a scheduled tomorrow for right knee pain and swelling Avoid smoking and alcohol  Home Health: None Equipment/Devices: None Discharge Condition: Stable CODE STATUS: Full code Diet recommendation: Heart healthy diet  Brief/Interim Summary: Patient is 65 year old male with past medical history of chronic tobacco abuse, osteoarthritis, chronic alcohol abuse presents to emergency department for evaluation of syncopal episode.  EMS was called and was given Narcan and apparently responded.  He uses crutches due to right knee pain.  Reported that his knee popped out and has been causing him severe pain and swelling.    ED course: Heart rate: 52, afebrile, blood pressure: 104/75, maintaining oxygen saturation 93% on 3 L via nasal cannula.  WBC: 12.1, CTA chest negative for PE shows emphysematous changes.  CT head negative, right knee x-ray shows moderate to large effusion.  CT cervical spine showed mild severe degenerative changes of C5-C6.  Patient admitted for further evaluation of syncopal episode.  Syncope: -Likley related to norcotics-as he responded well with Narcan & possible hypoxia due to underlying COPD -Extensive work-up such as CTA chest, CT head negative for acute findings.  CT cervical spine shows severe degenerative changes at C5-C6.  Urine drug: Negative.  Ethanol level: WNL UA negative for infection. -TSH, B12, folate: WNL -Echo shows preserved ejection fraction with grade 1 diastolic dysfunction -Consult PT recommended no PT follow-up  Acute hypoxemic respiratory failure in the setting of underlying COPD: -Patient does not use oxygen at home.    No wheezing noted on  exam -CTA chest negative for PE shows emphysematous changes. -Patient started on Breo Ellipta and albuterol as needed -He weaned off  to room air.  Maintaining oxygen saturation of 96% on room air and with walking his sats was 90 to 92%-did not qualify for home oxygen. -Refused nebulizer/inhaler  at the time of discharge.  Right knee pain/swelling: -Improved -X-ray shows moderate to large effusion -Patient reports improvement in pain and able to flex/extend without any issues. -Has a scheduled appointment with orthopedic ([facility]) on 2/22  Tobacco abuse:  -Smokes 1-1/2 packs of cigarettes per day.   -Nicotine patch ordered -Counseled about cessation  Alcohol abuse: Drinks 3 alcoholic drinks per day -Ethanol level: WNL -Started patient on CIWA protocol.  No alcohol withdrawal symptoms reported. -Continued folate, thiamine and multivitamin daily at the time of discharge  Leukocytosis:  Resolved  Macrocytosis: MCV: 102.7-improved to 99.2 -Likely in the setting of alcohol abuse.   -B12, folate, TSH: WNL  Marijuana use: -Uses marijuana now and then.  Counseled about cessation  Discharge Diagnoses:  Syncope Acute hypoxemic respiratory failure in the setting of underlying COPD Right knee pain/swelling Tobacco abuse En caul abuse Leukocytosis Macrocytosis Marijuana abuse  Discharge Instructions  Discharge Instructions    Diet - low sodium heart healthy   Complete by: As directed    Discharge instructions   Complete by: As directed    Follow-up with PCP in 1 week Follow-up with orthopedic surgery as a scheduled tomorrow for right knee pain and swelling Avoid smoking and alcohol   Increase activity slowly   Complete by: As directed      Allergies as of 02/13/2021   No Known Allergies     Medication List  TAKE these medications   allopurinol 100 MG tablet Commonly known as: ZYLOPRIM Take 200 mg by mouth daily.   Centrum Silver 50+Men Tabs Take 1  tablet by mouth daily.   colchicine 0.6 MG tablet Take 0.6 mg by mouth daily as needed (gout pain).   folic acid 1 MG tablet Commonly known as: FOLVITE Take 1 tablet (1 mg total) by mouth daily. Start taking on: February 14, 2021   HYDROcodone-acetaminophen 5-325 MG tablet Commonly known as: Norco Take 1 tablet by mouth every 4 (four) hours as needed for moderate pain.   naproxen sodium 220 MG tablet Commonly known as: ALEVE Take 220-440 mg by mouth daily as needed (pain).   thiamine 100 MG tablet Take 1 tablet (100 mg total) by mouth daily. Start taking on: February 14, 2021   traMADol 50 MG tablet Commonly known as: ULTRAM Take 1 tablet (50 mg total) by mouth 2 (two) times daily. What changed:   when to take this  reasons to take this       No Known Allergies  Consultations:  None   Procedures/Studies: CT Head Wo Contrast  Result Date: 02/11/2021 CLINICAL DATA:  Status post fall. EXAM: CT HEAD WITHOUT CONTRAST TECHNIQUE: Contiguous axial images were obtained from the base of the skull through the vertex without intravenous contrast. COMPARISON:  None. FINDINGS: Brain: No evidence of acute infarction, hemorrhage, hydrocephalus, extra-axial collection or mass lesion/mass effect. Vascular: No hyperdense vessel or unexpected calcification. Skull: Normal. Negative for fracture or focal lesion. Sinuses/Orbits: No acute finding. Other: None. IMPRESSION: No acute intracranial pathology. Electronically Signed   By: Virgina Norfolk M.D.   On: 02/11/2021 19:05   CT Angio Chest PE W and/or Wo Contrast  Result Date: 02/11/2021 CLINICAL DATA:  Status post fall. EXAM: CT ANGIOGRAPHY CHEST WITH CONTRAST TECHNIQUE: Multidetector CT imaging of the chest was performed using the standard protocol during bolus administration of intravenous contrast. Multiplanar CT image reconstructions and MIPs were obtained to evaluate the vascular anatomy. CONTRAST:  13mL OMNIPAQUE IOHEXOL 350 MG/ML  SOLN COMPARISON:  None. FINDINGS: Cardiovascular: There is mild calcification and atherosclerosis of the aortic arch. Satisfactory opacification of the pulmonary arteries to the segmental level. No evidence of pulmonary embolism. Normal heart size with mild coronary artery calcification. No pericardial effusion. Mediastinum/Nodes: No enlarged mediastinal, hilar, or axillary lymph nodes. Thyroid gland, trachea, and esophagus demonstrate no significant findings. Lungs/Pleura: Mild emphysematous lung disease is seen involving the bilateral upper lobes, right greater than left. Moderate severity fibrotic changes and honeycombing he is also noted within the posterior aspects of the bilateral lower lobes. Mild atelectasis is also seen within the bilateral lung bases. There is no evidence of a pleural effusion or pneumothorax. Upper Abdomen: No acute abnormality. Musculoskeletal: Multilevel degenerative changes seen throughout the thoracic spine. Review of the MIP images confirms the above findings. IMPRESSION: 1. No evidence of pulmonary embolism. 2. Moderate severity bilateral lower lobe fibrotic changes with mild bibasilar atelectasis. 3. Emphysema and aortic atherosclerosis. Aortic Atherosclerosis (ICD10-I70.0) and Emphysema (ICD10-J43.9). Electronically Signed   By: Virgina Norfolk M.D.   On: 02/11/2021 23:37   CT Cervical Spine Wo Contrast  Result Date: 02/11/2021 CLINICAL DATA:  Status post fall. EXAM: CT CERVICAL SPINE WITHOUT CONTRAST TECHNIQUE: Multidetector CT imaging of the cervical spine was performed without intravenous contrast. Multiplanar CT image reconstructions were also generated. COMPARISON:  None. FINDINGS: Alignment: Normal. Skull base and vertebrae: No acute fracture. No primary bone lesion or focal pathologic process. Soft tissues and  spinal canal: No prevertebral fluid or swelling. No visible canal hematoma. Disc levels: Marked severity endplate sclerosis is seen at the level of C5-C6.  Moderate to marked severity intervertebral disc space narrowing is also seen at the level of C5-C6. Mild bilateral multilevel facet joint hypertrophy is noted. Upper chest: Negative. Other: None. IMPRESSION: 1. Marked severity degenerative changes at the level of C5-C6. 2. No evidence of an acute fracture or subluxation. Electronically Signed   By: Virgina Norfolk M.D.   On: 02/11/2021 19:09   DG Chest Port 1 View  Result Date: 02/11/2021 CLINICAL DATA:  Chest pain. EXAM: PORTABLE CHEST 1 VIEW COMPARISON:  September 18, 2016 FINDINGS: Decreased lung volumes are seen which is likely, in part, secondary to the degree of patient inspiration. Mild, diffuse, chronic appearing increased interstitial lung markings are seen. Very mild areas of atelectasis and/or early infiltrate are noted within the bilateral lung bases. There is no evidence of a pleural effusion or pneumothorax. The cardiac silhouette is borderline in size. A thin, linear, radiopaque wire-like structure is seen overlying the cardiac silhouette, to the right of midline. A subcentimeter radiopaque soft tissue foreign body is again seen overlying the mid left clavicle. Degenerative changes seen throughout the thoracic spine. IMPRESSION: Chronic appearing increased interstitial lung markings with mild bibasilar atelectasis and/or early infiltrate. Electronically Signed   By: Virgina Norfolk M.D.   On: 02/11/2021 20:41   DG Knee Complete 4 Views Right  Result Date: 02/11/2021 CLINICAL DATA:  Status post trauma. EXAM: RIGHT KNEE - COMPLETE 4+ VIEW COMPARISON:  None. FINDINGS: No evidence of acute fracture or dislocation. Mild medial and lateral tibiofemoral compartment space narrowing is seen with degenerative changes also noted along the distal aspect of the right femur. A moderate to large joint effusion is seen. IMPRESSION: 1. Moderate to large joint effusion without evidence of acute fracture. Electronically Signed   By: Virgina Norfolk M.D.    On: 02/11/2021 19:00   ECHOCARDIOGRAM COMPLETE  Result Date: 02/12/2021    ECHOCARDIOGRAM REPORT   Patient Name:   JAVANI SPRATT Date of Exam: 02/12/2021 Medical Rec #:  [id number]       Height:       68.0 in Accession #:    [id number]      Weight:       160.0 lb Date of Birth:  1956-04-02       BSA:          1.859 m Patient Age:    25 years        BP:           122/71 mmHg Patient Gender: M               HR:           64 bpm. Exam Location:  Inpatient Procedure: 2D Echo and Cardiac Doppler Indications:    Syncope  History:        Patient has no prior history of Echocardiogram examinations.  Sonographer:    BW Referring Phys: [id number] Michell Heinrich Mayola Mcbain IMPRESSIONS  1. Left ventricular ejection fraction, by estimation, is 60 to 65%. The left ventricle has normal function. The left ventricle has no regional wall motion abnormalities. The left ventricular internal cavity size was mildly dilated. There is mild concentric left ventricular hypertrophy. Left ventricular diastolic parameters are consistent with Grade I diastolic dysfunction (impaired relaxation).  2. Right ventricular systolic function is normal. The right ventricular size is mildly enlarged.  3. Left  atrial size was mildly dilated.  4. Right atrial size was mildly dilated.  5. The mitral valve is normal in structure. Mild mitral valve regurgitation. No evidence of mitral stenosis.  6. The aortic valve is normal in structure. Aortic valve regurgitation is not visualized. No aortic stenosis is present.  7. The inferior vena cava is normal in size with greater than 50% respiratory variability, suggesting right atrial pressure of 3 mmHg. FINDINGS  Left Ventricle: Left ventricular ejection fraction, by estimation, is 60 to 65%. The left ventricle has normal function. The left ventricle has no regional wall motion abnormalities. The left ventricular internal cavity size was mildly dilated. There is  mild concentric left ventricular hypertrophy. Left ventricular  diastolic parameters are consistent with Grade I diastolic dysfunction (impaired relaxation). Normal left ventricular filling pressure. Right Ventricle: The right ventricular size is mildly enlarged. No increase in right ventricular wall thickness. Right ventricular systolic function is normal. Left Atrium: Left atrial size was mildly dilated. Right Atrium: Right atrial size was mildly dilated. Pericardium: There is no evidence of pericardial effusion. Mitral Valve: The mitral valve is normal in structure. Mild mitral valve regurgitation. No evidence of mitral valve stenosis. Tricuspid Valve: The tricuspid valve is normal in structure. Tricuspid valve regurgitation is mild . No evidence of tricuspid stenosis. Aortic Valve: The aortic valve is normal in structure. Aortic valve regurgitation is not visualized. No aortic stenosis is present. Pulmonic Valve: The pulmonic valve was normal in structure. Pulmonic valve regurgitation is not visualized. No evidence of pulmonic stenosis. Aorta: The aortic root is normal in size and structure. Venous: The inferior vena cava is normal in size with greater than 50% respiratory variability, suggesting right atrial pressure of 3 mmHg. IAS/Shunts: No atrial level shunt detected by color flow Doppler.  LEFT VENTRICLE PLAX 2D LVIDd:         5.60 cm  Diastology LVIDs:         3.60 cm  LV e' medial:    6.96 cm/s LV PW:         0.90 cm  LV E/e' medial:  9.5 LV IVS:        1.10 cm  LV e' lateral:   10.00 cm/s LVOT diam:     2.30 cm  LV E/e' lateral: 6.6 LV SV:         84 LV SV Index:   45 LVOT Area:     4.15 cm  RIGHT VENTRICLE RV S prime:     13.90 cm/s TAPSE (M-mode): 2.4 cm LEFT ATRIUM             Index       RIGHT ATRIUM           Index LA diam:        3.40 cm 1.83 cm/m  RA Area:     18.50 cm LA Vol (A2C):   67.1 ml 36.10 ml/m RA Volume:   58.00 ml  31.20 ml/m LA Vol (A4C):   38.4 ml 20.66 ml/m LA Biplane Vol: 54.3 ml 29.21 ml/m  AORTIC VALVE LVOT Vmax:   106.00 cm/s LVOT  Vmean:  70.400 cm/s LVOT VTI:    0.202 m  AORTA Ao Root diam: 3.20 cm MITRAL VALVE MV Area (PHT): 3.60 cm    SHUNTS MV Decel Time: 211 msec    Systemic VTI:  0.20 m MV E velocity: 65.80 cm/s  Systemic Diam: 2.30 cm MV A velocity: 61.50 cm/s MV E/A ratio:  1.07 Ena Dawley  MD Electronically signed by Ena Dawley MD Signature Date/Time: 02/12/2021/5:12:21 PM    Final        Subjective: Patient seen and examined.  No new complaints.  No further episodes of lightheadedness/dizziness.  His right knee pain and swelling has almost resolved and he has an appointment with orthopedic surgery tomorrow.  Wishes to go home today.  Discharge Exam: Vitals:   02/13/21 0840 02/13/21 0913  BP:    Pulse:    Resp:    Temp:    SpO2: 94% 95%   Vitals:   02/12/21 1654 02/12/21 2211 02/13/21 0840 02/13/21 0913  BP: 134/72 119/70    Pulse: 65 (!) 59    Resp: 18 16    Temp: 98 F (36.7 C) 98 F (36.7 C)    TempSrc:  Oral    SpO2: 98% 98% 94% 95%  Weight: 72.6 kg     Height: 5\' 9"  (1.753 m)       General: Pt is alert, awake, not in acute distress, on room air, communicating well Cardiovascular: RRR, S1/S2 +, no rubs, no gallops Respiratory: CTA bilaterally, no wheezing, no rhonchi Abdominal: Soft, NT, ND, bowel sounds + Extremities: no edema, no cyanosis    The results of significant diagnostics from this hospitalization (including imaging, microbiology, ancillary and laboratory) are listed below for reference.     Microbiology: Recent Results (from the past 240 hour(s))  Resp Panel by RT-PCR (Flu A&B, Covid) Nasopharyngeal Swab     Status: None   Collection Time: 02/11/21  7:01 PM   Specimen: Nasopharyngeal Swab; Nasopharyngeal(NP) swabs in vial transport medium  Result Value Ref Range Status   SARS Coronavirus 2 by RT PCR NEGATIVE NEGATIVE Final    Comment: (NOTE) SARS-CoV-2 target nucleic acids are NOT DETECTED.  The SARS-CoV-2 RNA is generally detectable in upper  respiratory specimens during the acute phase of infection. The lowest concentration of SARS-CoV-2 viral copies this assay can detect is 138 copies/mL. A negative result does not preclude SARS-Cov-2 infection and should not be used as the sole basis for treatment or other patient management decisions. A negative result may occur with  improper specimen collection/handling, submission of specimen other than nasopharyngeal swab, presence of viral mutation(s) within the areas targeted by this assay, and inadequate number of viral copies(<138 copies/mL). A negative result must be combined with clinical observations, patient history, and epidemiological information. The expected result is Negative.  Fact Sheet for Patients:  EntrepreneurPulse.com.au  Fact Sheet for Healthcare Providers:  IncredibleEmployment.be  This test is no t yet approved or cleared by the [place] FDA and  has been authorized for detection and/or diagnosis of SARS-CoV-2 by FDA under an Emergency Use Authorization (EUA). This EUA will remain  in effect (meaning this test can be used) for the duration of the COVID-19 declaration under Section 564(b)(1) of the Act, 21 U.S.C.section 360bbb-3(b)(1), unless the authorization is terminated  or revoked sooner.       Influenza A by PCR NEGATIVE NEGATIVE Final   Influenza B by PCR NEGATIVE NEGATIVE Final    Comment: (NOTE) The Xpert Xpress SARS-CoV-2/FLU/RSV plus assay is intended as an aid in the diagnosis of influenza from Nasopharyngeal swab specimens and should not be used as a sole basis for treatment. Nasal washings and aspirates are unacceptable for Xpert Xpress SARS-CoV-2/FLU/RSV testing.  Fact Sheet for Patients: EntrepreneurPulse.com.au  Fact Sheet for Healthcare Providers: IncredibleEmployment.be  This test is not yet approved or cleared by the [place] and  has been  authorized for detection and/or diagnosis of SARS-CoV-2 by FDA under an Emergency Use Authorization (EUA). This EUA will remain in effect (meaning this test can be used) for the duration of the COVID-19 declaration under Section 564(b)(1) of the Act, 21 U.S.C. section 360bbb-3(b)(1), unless the authorization is terminated or revoked.  Performed at [facility], [place] [address] [place], [place] [address]      Labs: BNP (last 3 results) No results for input(s): BNP in the last 8760 hours. Basic Metabolic Panel: Recent Labs  Lab 02/11/21 1950  NA 138  K 3.8  CL 105  CO2 24  GLUCOSE 165*  BUN 10  CREATININE 0.91  CALCIUM 9.3  MG 2.0   Liver Function Tests: Recent Labs  Lab 02/11/21 1950  AST 28  ALT 23  ALKPHOS 65  BILITOT 0.6  PROT 7.1  ALBUMIN 3.8   No results for input(s): LIPASE, AMYLASE in the last 168 hours. No results for input(s): AMMONIA in the last 168 hours. CBC: Recent Labs  Lab 02/11/21 1950 02/12/21 0401 02/13/21 0248  WBC 12.1* 9.1 8.3  NEUTROABS 10.7*  --   --   HGB 15.0 13.8 13.4  HCT 46.2 42.1 38.2*  MCV 102.7* 102.7* 99.2  PLT 188 184 175   Cardiac Enzymes: No results for input(s): CKTOTAL, CKMB, CKMBINDEX, TROPONINI in the last 168 hours. BNP: Invalid input(s): POCBNP CBG: Recent Labs  Lab 02/12/21 0004  GLUCAP 92   D-Dimer Recent Labs    02/11/21 2048  DDIMER 1.02*   Hgb A1c No results for input(s): HGBA1C in the last 72 hours. Lipid Profile No results for input(s): CHOL, HDL, LDLCALC, TRIG, CHOLHDL, LDLDIRECT in the last 72 hours. Thyroid function studies Recent Labs    02/12/21 1655  TSH 1.219   Anemia work up Recent Labs    02/12/21 1655  VITAMINB12 252  FOLATE 15.0   Urinalysis    Component Value Date/Time   COLORURINE YELLOW 02/12/2021 [place] 02/12/2021 0414   LABSPEC >1.046 (H) 02/12/2021 0414   PHURINE 5.0 02/12/2021 0414   GLUCOSEU NEGATIVE 02/12/2021 0414   HGBUR NEGATIVE  02/12/2021 0414   BILIRUBINUR NEGATIVE 02/12/2021 0414   KETONESUR NEGATIVE 02/12/2021 0414   PROTEINUR NEGATIVE 02/12/2021 0414   NITRITE NEGATIVE 02/12/2021 0414   LEUKOCYTESUR NEGATIVE 02/12/2021 0414   Sepsis Labs Invalid input(s): PROCALCITONIN,  WBC,  LACTICIDVEN Microbiology Recent Results (from the past 240 hour(s))  Resp Panel by RT-PCR (Flu A&B, Covid) Nasopharyngeal Swab     Status: None   Collection Time: 02/11/21  7:01 PM   Specimen: Nasopharyngeal Swab; Nasopharyngeal(NP) swabs in vial transport medium  Result Value Ref Range Status   SARS Coronavirus 2 by RT PCR NEGATIVE NEGATIVE Final    Comment: (NOTE) SARS-CoV-2 target nucleic acids are NOT DETECTED.  The SARS-CoV-2 RNA is generally detectable in upper respiratory specimens during the acute phase of infection. The lowest concentration of SARS-CoV-2 viral copies this assay can detect is 138 copies/mL. A negative result does not preclude SARS-Cov-2 infection and should not be used as the sole basis for treatment or other patient management decisions. A negative result may occur with  improper specimen collection/handling, submission of specimen other than nasopharyngeal swab, presence of viral mutation(s) within the areas targeted by this assay, and inadequate number of viral copies(<138 copies/mL). A negative result must be combined with clinical observations, patient history, and epidemiological information. The expected result is Negative.  Fact Sheet for  Patients:  EntrepreneurPulse.com.au  Fact Sheet for Healthcare Providers:  IncredibleEmployment.be  This test is no t yet approved or cleared by the [place] FDA and  has been authorized for detection and/or diagnosis of SARS-CoV-2 by FDA under an Emergency Use Authorization (EUA). This EUA will remain  in effect (meaning this test can be used) for the duration of the COVID-19 declaration under Section 564(b)(1) of  the Act, 21 U.S.C.section 360bbb-3(b)(1), unless the authorization is terminated  or revoked sooner.       Influenza A by PCR NEGATIVE NEGATIVE Final   Influenza B by PCR NEGATIVE NEGATIVE Final    Comment: (NOTE) The Xpert Xpress SARS-CoV-2/FLU/RSV plus assay is intended as an aid in the diagnosis of influenza from Nasopharyngeal swab specimens and should not be used as a sole basis for treatment. Nasal washings and aspirates are unacceptable for Xpert Xpress SARS-CoV-2/FLU/RSV testing.  Fact Sheet for Patients: EntrepreneurPulse.com.au  Fact Sheet for Healthcare Providers: IncredibleEmployment.be  This test is not yet approved or cleared by the [place] FDA and has been authorized for detection and/or diagnosis of SARS-CoV-2 by FDA under an Emergency Use Authorization (EUA). This EUA will remain in effect (meaning this test can be used) for the duration of the COVID-19 declaration under Section 564(b)(1) of the Act, 21 U.S.C. section 360bbb-3(b)(1), unless the authorization is terminated or revoked.  Performed at [facility], [place] [address], [place], [place] [address]      Time coordinating discharge: Over 30 minutes  SIGNED:   Mckinley Jewel, MD  [facility] 02/13/2021, 11:59 AM Pager   If 7PM-7AM, please contact night-coverage [email or web address]

## 2021-02-13 NOTE — Progress Notes (Signed)
Physical Therapy Treatment and Discharge Patient Details Name: Edwin Hudson MRN: [id number] DOB: 09/08/1956 Today's Date: 02/13/2021    History of Present Illness Edwin Hudson is a 65 y.o. male with hx of chronic tobacco abuse, OA, COPD, chronic alcohol use, who presented to the ED on 02/11/2021 via EMS from his ex-wife's house secondary to syncopal episode. Found to have acute hypoxic respiratory failure. Currenlty using crutches due to right knee pain that has now "popped back into place and is doing better"    PT Comments    Patient ambulating better today with less pain and did not need his crutch or oxygen. Lowest sats 89% with quick return to 91% with cue for deep breath. Able to go up/down 10 steps with 1 rail without difficulty. No further PT needs as all goals met.     Follow Up Recommendations  No PT follow up     Equipment Recommendations  None recommended by PT (has crutches)    Recommendations for Other Services       Precautions / Restrictions Precautions Precautions: Fall Restrictions Weight Bearing Restrictions: No    Mobility  Bed Mobility Overal bed mobility: Independent             General bed mobility comments: pt able to get to bed with increased effort, pt then vomited    Transfers Overall transfer level: Modified independent Equipment used: None             General transfer comment: no assist required  Ambulation/Gait Ambulation/Gait assistance: Modified independent (Device/Increase time)   Assistive device: None Gait Pattern/deviations: Step-through pattern;Antalgic Gait velocity: wfl   General Gait Details: on room air with lowest sat 89% with cues to take a breath incr to 91%   Stairs Stairs: Yes Stairs assistance: Supervision;Modified independent (Device/Increase time) Stair Management: One rail Right Number of Stairs: 10 General stair comments: initial cues for sequencing to ascend step-to; pt progressed to  step-through; descending he stayed with step-to pattern   Wheelchair Mobility    Modified Rankin (Stroke Patients Only)       Balance Overall balance assessment: Mild deficits observed, not formally tested                                          Cognition Arousal/Alertness: Awake/alert Behavior During Therapy: WFL for tasks assessed/performed Overall Cognitive Status: Within Functional Limits for tasks assessed                                        Exercises      General Comments        Pertinent Vitals/Pain Pain Assessment: Faces Faces Pain Scale: Hurts a little bit Pain Location: rt knee Pain Descriptors / Indicators: Sore    Home Living                      Prior Function            PT Goals (current goals can now be found in the care plan section) Acute Rehab PT Goals Patient Stated Goal: to go home PT Goal Formulation: With patient Time For Goal Achievement: 02/26/21 Potential to Achieve Goals: Good Progress towards PT goals: Goals met/education completed, patient discharged from PT    Frequency  Min 3X/week      PT Plan Current plan remains appropriate    Co-evaluation              AM-PAC PT "6 Clicks" Mobility   Outcome Measure  Help needed turning from your back to your side while in a flat bed without using bedrails?: None Help needed moving from lying on your back to sitting on the side of a flat bed without using bedrails?: None Help needed moving to and from a bed to a chair (including a wheelchair)?: None Help needed standing up from a chair using your arms (e.g., wheelchair or bedside chair)?: None Help needed to walk in hospital room?: None Help needed climbing 3-5 steps with a railing? : None 6 Click Score: 24    End of Session   Activity Tolerance: Patient tolerated treatment well Patient left: in bed;with call bell/phone within reach Nurse Communication: Mobility  status;Other (comment) (OK to dc from PT perspective) PT Visit Diagnosis: Difficulty in walking, not elsewhere classified (R26.2)     Time: 1131-1140 PT Time Calculation (min) (ACUTE ONLY): 9 min  Charges:  $Gait Training: 8-22 mins                      Arby Barrette, PT Pager [phone]    Rexanne Mano 02/13/2021, 11:47 AM

## 2021-02-14 ENCOUNTER — Encounter: Payer: Self-pay | Admitting: Orthopaedic Surgery

## 2021-02-14 ENCOUNTER — Ambulatory Visit (INDEPENDENT_AMBULATORY_CARE_PROVIDER_SITE_OTHER): Payer: Medicare Other | Admitting: Orthopaedic Surgery

## 2021-02-14 DIAGNOSIS — M1711 Unilateral primary osteoarthritis, right knee: Secondary | ICD-10-CM

## 2021-02-14 MED ORDER — BUPIVACAINE HCL 0.25 % IJ SOLN
2.0000 mL | INTRAMUSCULAR | Status: AC | PRN
  Administered 2021-02-14: 2 mL via INTRA_ARTICULAR

## 2021-02-14 MED ORDER — METHYLPREDNISOLONE ACETATE 40 MG/ML IJ SUSP
40.0000 mg | INTRAMUSCULAR | Status: AC | PRN
  Administered 2021-02-14: 40 mg via INTRA_ARTICULAR

## 2021-02-14 MED ORDER — LIDOCAINE HCL 1 % IJ SOLN
2.0000 mL | INTRAMUSCULAR | Status: AC | PRN
  Administered 2021-02-14: 2 mL

## 2021-02-14 NOTE — Progress Notes (Signed)
Office Visit Note   Patient: Edwin Hudson           Date of Birth: [birth year]           MRN: [id number] Visit Date: 02/14/2021              Requested by: No referring provider defined for this encounter. PCP: Pcp, No   Assessment & Plan: Visit Diagnoses:  1. Unilateral primary osteoarthritis, right knee     Plan: Impression is right knee arthritis flareup with possible patella subluxation/dislocation event. We have discussed proceeding with intra-articular cortisone injection as well as PSO brace and physical therapy for which she would like to proceed. Internal referral has been made. Follow-up with [place] as needed.  Follow-Up Instructions: Return if symptoms worsen or fail to improve.   Orders:  No orders of the defined types were placed in this encounter.  No orders of the defined types were placed in this encounter.     Procedures: Large Joint Inj: R knee on 02/14/2021 2:05 PM Indications: pain Details: 22 G needle, anterolateral approach Medications: 2 mL lidocaine 1 %; 2 mL bupivacaine 0.25 %; 40 mg methylPREDNISolone acetate 40 MG/ML      Clinical Data: No additional findings.   Subjective: Chief Complaint  Patient presents with  . Right Knee - Pain    HPI patient is a pleasant 65 year old gentleman who comes in today following an injury to his right knee. He notes that he had initial injury back in ninth grade while playing soccer where his knee dislocated. He did well for many years until recently when he has had a few what sounds like patella dislocations. Most recent one he had was this past Thursday when he was sitting in a chair and twisted to the right causing his kneecap to come out of place. He had a syncopal episode soon after and when he woke up his knee was better. He was admitted to the hospital for this where he was discharged yesterday. X-rays of the knee were obtained which were negative for fracture/dislocation. He is here today for further  evaluation. The pain he has is to the medial and lateral aspects of the knee. Flexion of the knee makes his symptoms worse. He notes that his swelling has significantly improved. He has been taking Advil with minimal relief of symptoms. No recent injection to the right knee. Of note, he is status post left total knee replacement by Dr. Durward Fortes.  Review of Systems as detailed in HPI. All others reviewed and are negative.   Objective: Vital Signs: There were no vitals taken for this visit.  Physical Exam well-developed well-nourished gentleman in no acute distress. Alert and oriented x3.  Ortho Exam right knee exam shows a small effusion. Range of motion 0 to 95 degrees. Minimal lateral joint line tenderness. He is stable valgus varus stress. No patellar apprehension. He is neurovascular intact distally.  Specialty Comments:  No specialty comments available.  Imaging: X-rays reviewed by me in canopy show advanced degenerative changes to all 3 compartments   PMFS History: Patient Active Problem List   Diagnosis Date Noted  . Acute respiratory failure with hypoxia ([place]) 02/12/2021  . Syncope and collapse 02/12/2021  . Effusion, right knee 02/12/2021  . COPD (chronic obstructive pulmonary disease) with emphysema ([place]) 02/12/2021  . Tobacco abuse 02/12/2021  . Alcohol use 02/12/2021  . Low back pain 12/14/2020  . Primary osteoarthritis of left knee 09/25/2016  . S/P total knee replacement  using cement, left 09/25/2016  . Gout 07/29/2012   Past Medical History:  Diagnosis Date  . Cancer [facility])    bladder cancer  . Gout     History reviewed. No pertinent family history.  Past Surgical History:  Procedure Laterality Date  . COLONOSCOPY    . KNEE SURGERY Right 1990  . TOTAL KNEE ARTHROPLASTY Left 09/25/2016   Procedure: TOTAL KNEE ARTHROPLASTY;  Surgeon: Garald Balding, MD;  Location: [facility];  Service: Orthopedics;  Laterality: Left;  block done per Dr. Linna Caprice at [facility] History  . Not on file  Tobacco Use  . Smoking status: Current Every Day Smoker    Packs/day: 1.00    Years: 40.00    Pack years: 40.00    Types: Cigarettes  . Smokeless tobacco: Never Used  Vaping Use  . Vaping Use: Never used  Substance and Sexual Activity  . Alcohol use: Yes    Alcohol/week: 0.0 standard drinks    Comment: usually on weekends  . Drug use: No  . Sexual activity: Not on file

## 2021-02-21 LAB — VITAMIN B1: Vitamin B1 (Thiamine): 196.6 nmol/L (ref 66.5–200.0)

## 2021-05-09 ENCOUNTER — Encounter: Payer: Self-pay | Admitting: Orthopaedic Surgery

## 2021-05-09 ENCOUNTER — Ambulatory Visit: Payer: Medicare Other | Admitting: Orthopaedic Surgery

## 2021-05-09 ENCOUNTER — Other Ambulatory Visit: Payer: Self-pay

## 2021-05-09 VITALS — Ht 69.0 in | Wt 160.0 lb

## 2021-05-09 DIAGNOSIS — M25461 Effusion, right knee: Secondary | ICD-10-CM

## 2021-05-09 MED ORDER — BUPIVACAINE HCL 0.25 % IJ SOLN
2.0000 mL | INTRAMUSCULAR | Status: AC | PRN
  Administered 2021-05-09: 2 mL via INTRA_ARTICULAR

## 2021-05-09 MED ORDER — LIDOCAINE HCL 1 % IJ SOLN
2.0000 mL | INTRAMUSCULAR | Status: AC | PRN
  Administered 2021-05-09: 2 mL

## 2021-05-09 NOTE — Progress Notes (Signed)
Office Visit Note   Patient: Edwin Hudson           Date of Birth: [birth year]           MRN: [id number] Visit Date: 05/09/2021              Requested by: No referring provider defined for this encounter. PCP: Pcp, No   Assessment & Plan: Visit Diagnoses:  1. Effusion, right knee     Plan: Mr. [name] experienced acute onset of right knee pain this past Thursday i.e. 5 days ago.  His knee "locked up".  He feels a little improvement and swelling seems to be less but still having considerable discomfort.  He is tried Aleve and Tylenol.  He saw Dr. Erlinda Hong in February and received a cortisone injection with films demonstrating significant arthritis particularly in the lateral compartment.  I think his problem is related to the arthritis and will inject the lateral compartment with betamethasone and monitor response.  Follow-Up Instructions: Return if symptoms worsen or fail to improve.   Orders:  Orders Placed This Encounter  Procedures  . Large Joint Inj: R knee   No orders of the defined types were placed in this encounter.     Procedures: Large Joint Inj: R knee on 05/09/2021 4:59 PM Indications: pain and diagnostic evaluation Details: 25 G 1.5 in needle, anteromedial approach  Arthrogram: No  Medications: 2 mL lidocaine 1 %; 2 mL bupivacaine 0.25 %  12 mg betamethasone injected into right knee lateral compartment with Xylocaine and Marcaine Procedure, treatment alternatives, risks and benefits explained, specific risks discussed. Consent was given by the patient. Immediately prior to procedure a time out was called to verify the correct patient, procedure, equipment, support staff and site/side marked as required. Patient was prepped and draped in the usual sterile fashion.       Clinical Data: No additional findings.   Subjective: Chief Complaint  Patient presents with  . Right Knee - Pain  Patient presents today for his right knee. He saw Dr.Xu in February of  2022 and received a cortisone injection. He states that the cortisone injection did not help. Patient states that his knee locked up last Thursday and has been unable to extend his leg. He said that there has been some improvement. He is taking over the counter pain medicine.   HPI  Review of Systems   Objective: Vital Signs: Ht 5\' 9"  (1.753 m)   Wt 160 lb (72.6 kg)   BMI 23.63 kg/m   Physical Exam Constitutional:      Appearance: He is well-developed.  Eyes:     Pupils: Pupils are equal, round, and reactive to light.  Pulmonary:     Effort: Pulmonary effort is normal.  Skin:    General: Skin is warm and dry.  Neurological:     Mental Status: He is alert and oriented to person, place, and time.  Psychiatric:        Behavior: Behavior normal.     Ortho Exam right knee was not hot red warm or swollen.  Has some recurrent lesions in the skin consistent with a granulomatous disease but asymptomatic.  Having a bit more lateral than medial joint pain.  There is positive patella crepitation and palpable osteophytes.  No instability.  Lacks just a few degrees to full extension but flexed over 105 degrees.  No calf pain no distal edema  Specialty Comments:  No specialty comments available.  Imaging: No results found.  PMFS History: Patient Active Problem List   Diagnosis Date Noted  . Acute respiratory failure with hypoxia ([place]) 02/12/2021  . Syncope and collapse 02/12/2021  . Effusion, right knee 02/12/2021  . COPD (chronic obstructive pulmonary disease) with emphysema ([place]) 02/12/2021  . Tobacco abuse 02/12/2021  . Alcohol use 02/12/2021  . Low back pain 12/14/2020  . Primary osteoarthritis of left knee 09/25/2016  . S/P total knee replacement using cement, left 09/25/2016  . Gout 07/29/2012   Past Medical History:  Diagnosis Date  . Cancer [facility])    bladder cancer  . Gout     History reviewed. No pertinent family history.  Past Surgical History:  Procedure  Laterality Date  . COLONOSCOPY    . KNEE SURGERY Right 1990  . TOTAL KNEE ARTHROPLASTY Left 09/25/2016   Procedure: TOTAL KNEE ARTHROPLASTY;  Surgeon: Garald Balding, MD;  Location: [facility];  Service: Orthopedics;  Laterality: Left;  block done per Dr. Linna Caprice at [facility] History  . Not on file  Tobacco Use  . Smoking status: Current Every Day Smoker    Packs/day: 1.00    Years: 40.00    Pack years: 40.00    Types: Cigarettes  . Smokeless tobacco: Never Used  Vaping Use  . Vaping Use: Never used  Substance and Sexual Activity  . Alcohol use: Yes    Alcohol/week: 0.0 standard drinks    Comment: usually on weekends  . Drug use: No  . Sexual activity: Not on file

## 2021-05-12 ENCOUNTER — Other Ambulatory Visit: Payer: Self-pay | Admitting: Orthopaedic Surgery

## 2021-05-12 ENCOUNTER — Telehealth: Payer: Self-pay | Admitting: Orthopaedic Surgery

## 2021-05-12 MED ORDER — TRAMADOL HCL 50 MG PO TABS: 50.0000 mg | ORAL_TABLET | Freq: Three times a day (TID) | ORAL | 0 refills | Status: DC | PRN

## 2021-05-12 NOTE — Telephone Encounter (Signed)
Sent via imprivata-called patient and left a message

## 2021-05-12 NOTE — Telephone Encounter (Signed)
Patient called. He would like some Tramadol called in for his pain. His call back number is [phone]

## 2021-05-12 NOTE — Telephone Encounter (Signed)
noted 

## 2021-05-29 ENCOUNTER — Telehealth: Payer: Self-pay

## 2021-05-29 ENCOUNTER — Other Ambulatory Visit: Payer: Self-pay | Admitting: Orthopaedic Surgery

## 2021-05-29 NOTE — Telephone Encounter (Signed)
Please advise 

## 2021-05-29 NOTE — Telephone Encounter (Signed)
Patient called he is requesting rx refill for tramadol call back:[phone]

## 2021-05-30 ENCOUNTER — Other Ambulatory Visit: Payer: Self-pay | Admitting: Orthopaedic Surgery

## 2021-05-30 MED ORDER — TRAMADOL HCL 50 MG PO TABS: 50.0000 mg | ORAL_TABLET | Freq: Two times a day (BID) | ORAL | 0 refills | Status: DC | PRN

## 2021-05-30 NOTE — Telephone Encounter (Signed)
Can you please deny this one? I do not have access. Thanks.

## 2021-05-30 NOTE — Telephone Encounter (Signed)
Sent in

## 2021-05-30 NOTE — Telephone Encounter (Signed)
I called patient to advise. No answer.  

## 2021-05-30 NOTE — Telephone Encounter (Signed)
I called patient and advised. 

## 2021-06-01 ENCOUNTER — Ambulatory Visit: Payer: Medicare Other | Admitting: Orthopaedic Surgery

## 2021-06-01 ENCOUNTER — Other Ambulatory Visit: Payer: Self-pay

## 2021-06-01 ENCOUNTER — Encounter: Payer: Self-pay | Admitting: Orthopaedic Surgery

## 2021-06-01 DIAGNOSIS — M25461 Effusion, right knee: Secondary | ICD-10-CM | POA: Diagnosis not present

## 2021-06-01 DIAGNOSIS — M1711 Unilateral primary osteoarthritis, right knee: Secondary | ICD-10-CM | POA: Diagnosis not present

## 2021-06-01 NOTE — Progress Notes (Signed)
Office Visit Note   Patient: Edwin Hudson           Date of Birth: 1956-10-30           MRN: [id number] Visit Date: 06/01/2021              Requested by: No referring provider defined for this encounter. PCP: Pcp, No   Assessment & Plan: Visit Diagnoses:  1. Effusion, right knee   2. Unilateral primary osteoarthritis, right knee     Plan: Edwin Hudson has considerable arthritis in his right knee with recurrent effusion.  He presently is using crutches and having difficulty despite cortisone injection.  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  He has had a previous successful left total knee replacement and I think the best approach would be right total knee replacement.  He agrees.  We will get clearance from his medical physician and set up the surgery .  Follow-Up Instructions: Return We will schedule total knee replacement.   Orders:  No orders of the defined types were placed in this encounter.  No orders of the defined types were placed in this encounter.     Procedures: No procedures performed   Clinical Data: No additional findings.   Subjective: Chief Complaint  Patient presents with   Right Knee - Pain    Very painful  Not much response with persistent effusion and pain despite the knee aspiration and cortisone injection.  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  Physical Exam Constitutional:      Appearance: He is well-developed.  Eyes:     Pupils: Pupils are equal, round, and reactive to light.  Pulmonary:     Effort: Pulmonary effort is normal.  Skin:    General: Skin is warm and dry.  Neurological:     Mental Status: He is alert and oriented to person, place, and time.  Psychiatric:        Behavior: Behavior normal.    Ortho Exam  positive effusion right knee with mild medial lateral joint pain.  Lacked about 5 degrees to full extension and flex at least 110 degrees without instability.  No popliteal pain or mass.  No distal edema.  Has a chronic skin condition which is benign.  Neurologically intact  Specialty Comments:  No specialty comments available.  Imaging: No results found.   PMFS History: Patient Active Problem List   Diagnosis Date Noted   Unilateral primary osteoarthritis, right knee 06/01/2021   Acute respiratory failure with hypoxia ([place]) 02/12/2021   Syncope and collapse 02/12/2021   Effusion, right knee 02/12/2021   COPD (chronic obstructive pulmonary disease) with emphysema ([place]) 02/12/2021   Tobacco abuse 02/12/2021   Alcohol use 02/12/2021   Low back pain 12/14/2020   Primary osteoarthritis of left knee 09/25/2016   S/P total knee replacement using cement, left 09/25/2016   Gout 07/29/2012   Past Medical History:  Diagnosis Date   Cancer ([place])    bladder cancer   Gout     History reviewed. No pertinent family history.  Past Surgical History:  Procedure Laterality Date   COLONOSCOPY     KNEE SURGERY Right 1990   TOTAL KNEE ARTHROPLASTY Left 09/25/2016   Procedure: TOTAL KNEE ARTHROPLASTY;  Surgeon: Garald Balding, MD;  Location: [facility];  Service:  Orthopedics;  Laterality: Left;  block done per Dr. Linna Caprice at [place] History   Not on file  Tobacco Use   Smoking status: Every Day    Packs/day: 1.00    Years: 40.00    Pack years: 40.00    Types: Cigarettes   Smokeless tobacco: Never  Vaping Use   Vaping Use: Never used  Substance and Sexual Activity   Alcohol use: Yes    Alcohol/week: 0.0 standard drinks    Comment: usually on weekends   Drug use: No   Sexual activity: Not on file     Garald Balding, MD   Note - This record has been created using [employer].  Chart creation errors have been sought, but may not always  have been  located. Such creation errors do not reflect on  the standard of medical care.

## 2021-06-02 ENCOUNTER — Telehealth: Payer: Self-pay

## 2021-06-02 NOTE — Telephone Encounter (Signed)
Patient called he stated he had a couple of tests done at [facility] mid February regarding pre operative clarence patient wants to know if we are able to see the results and if not can we request the results call back:[phone]

## 2021-06-05 NOTE — Telephone Encounter (Signed)
Called and left message.

## 2021-06-21 ENCOUNTER — Other Ambulatory Visit: Payer: Self-pay

## 2021-06-27 NOTE — Progress Notes (Signed)
Please put in order for PAT visit 06/28/21.

## 2021-06-27 NOTE — Progress Notes (Addendum)
COVID Vaccine Completed: yes x3 Date COVID Vaccine completed: Has received booster: yes in feb per pt COVID vaccine manufacturer:   Moderna     Date of COVID positive in last 90 days: N/A  PCP - [facility] in [place], pt cannot remember name of NP Cardiologist - N/A  Chest x-ray - 02/11/21 Epic EKG - 02/11/21 Epic Stress Test - Greater than 5 years ECHO - 02/12/21 Epic Cardiac Cath - N/A Pacemaker/ICD device last checked: N/A Spinal Cord Stimulator: N/A  Needs medical clearance, pt has appt 06/29/21 at 1245  Sleep Study - N/A CPAP -   Fasting Blood Sugar - N/A Checks Blood Sugar _____ times a day  Blood Thinner Instructions: N/A Aspirin Instructions: Last Dose:  Activity level: Can go up a flight of stairs and perform activities of daily living without stopping and without symptoms of chest pain or shortness of breath. Able to exercise without symptoms      Anesthesia review: COPD, left posterior fascicular block, syncopal and hypoxic episode in February 2022.  Patient denies shortness of breath, fever, cough and chest pain at PAT appointment   Patient verbalized understanding of instructions that were given to them at the PAT appointment. Patient was also instructed that they will need to review over the PAT instructions again at home before surgery.

## 2021-06-28 ENCOUNTER — Encounter (HOSPITAL_COMMUNITY): Payer: Self-pay

## 2021-06-28 ENCOUNTER — Encounter (HOSPITAL_COMMUNITY)
Admission: RE | Admit: 2021-06-28 | Discharge: 2021-06-28 | Disposition: A | Discharge status: Home / Self Care | Payer: Medicare Other | Source: Ambulatory Visit | Attending: Orthopaedic Surgery | Admitting: Orthopaedic Surgery

## 2021-06-28 ENCOUNTER — Other Ambulatory Visit: Payer: Self-pay

## 2021-06-28 DIAGNOSIS — Z01812 Encounter for preprocedural laboratory examination: Secondary | ICD-10-CM | POA: Insufficient documentation

## 2021-06-28 HISTORY — DX: Unspecified osteoarthritis, unspecified site: M19.90

## 2021-06-28 HISTORY — DX: Chronic obstructive pulmonary disease, unspecified: J44.9

## 2021-06-28 LAB — CBC
HCT: 42.7 % (ref 39.0–52.0)
Hemoglobin: 14.1 g/dL (ref 13.0–17.0)
MCH: 32.6 pg (ref 26.0–34.0)
MCHC: 33 g/dL (ref 30.0–36.0)
MCV: 98.8 fL (ref 80.0–100.0)
Platelets: 240 10*3/uL (ref 150–400)
RBC: 4.32 MIL/uL (ref 4.22–5.81)
RDW: 13.1 % (ref 11.5–15.5)
WBC: 10.7 10*3/uL — ABNORMAL HIGH (ref 4.0–10.5)
nRBC: 0 % (ref 0.0–0.2)

## 2021-06-28 LAB — BASIC METABOLIC PANEL
Anion gap: 8 (ref 5–15)
BUN: 9 mg/dL (ref 8–23)
CO2: 28 mmol/L (ref 22–32)
Calcium: 9.7 mg/dL (ref 8.9–10.3)
Chloride: 103 mmol/L (ref 98–111)
Creatinine, Ser: 0.82 mg/dL (ref 0.61–1.24)
GFR, Estimated: >60 mL/min (ref >60)
Glucose, Bld: 100 mg/dL — ABNORMAL HIGH (ref 70–99)
Potassium: 4.3 mmol/L (ref 3.5–5.1)
Sodium: 139 mmol/L (ref 135–145)

## 2021-06-28 LAB — SURGICAL PCR SCREEN
MRSA, PCR: NEGATIVE
Staphylococcus aureus: NEGATIVE

## 2021-06-28 NOTE — Patient Instructions (Addendum)
DUE TO COVID-19 ONLY ONE VISITOR IS ALLOWED TO COME WITH YOU AND STAY IN THE WAITING ROOM ONLY DURING PRE OP AND PROCEDURE.   **NO VISITORS ARE ALLOWED IN THE SHORT STAY AREA OR RECOVERY ROOM!!**  IF YOU WILL BE ADMITTED INTO THE HOSPITAL YOU ARE ALLOWED ONLY TWO SUPPORT PEOPLE DURING VISITATION HOURS ONLY (10AM -8PM)   The support person(s) may change daily. The support person(s) must pass our screening, gel in and out, and wear a mask at all times, including in the patient's room. Patients must also wear a mask when staff or their support person are in the room.  No visitors under the age of 26. Any visitor under the age of 68 must be accompanied by an adult.    COVID SWAB TESTING MUST BE COMPLETED ON:  06/30/21 @ 10:00 AM   [address] [place], [place] [address]   You are not required to quarantine, however you are required to wear a well-fitted mask when you are out and around people not in your household.  Hand Hygiene often Do NOT share personal items Notify your provider if you are in close contact with someone who has COVID or you develop fever 100.4 or greater, new onset of sneezing, cough, sore throat, shortness of breath or body aches.       Your procedure is scheduled on: 07/04/21   Report to [facility] Main  Entrance   Report to Short Stay at 5:15 AM   [facility] [place])   Call this number if you have problems the morning of surgery [phone]   Do not eat food :After Midnight.   May have liquids until 4:15 AM day of surgery  CLEAR LIQUID DIET  Foods Allowed                                                                     Foods Excluded  Water, Black Coffee and tea, regular and decaf               liquids that you cannot  Plain Jell-O in any flavor  (No red)                                     see through such as: Fruit ices (not with fruit pulp)                                             milk, soups, orange juice              Iced Popsicles (No red)                                                  All solid food  Apple juices Sports drinks like Gatorade (No red) Lightly seasoned clear broth or consume(fat free) Sugar, honey syrup     Oral Hygiene is also important to reduce your risk of infection.                                    Remember - BRUSH YOUR TEETH THE MORNING OF SURGERY WITH YOUR REGULAR TOOTHPASTE   Do NOT smoke after Midnight   Take these medicines the morning of surgery with A SIP OF WATER: Acetaminophen, Allopurinol.                               You may not have any metal on your body including jewelry, and body piercing             Do not wear lotions, powders, cologne, or deodorant              Men may shave face and neck.   Do not bring valuables to the hospital. [place].   Contacts, dentures or bridgework may not be worn into surgery.   Bring small overnight bag day of surgery.   Special Instructions: Bring a copy of your healthcare power of attorney and living will documents         the day of surgery if you haven't scanned them in before.   Please read over the following fact sheets you were given: IF YOU HAVE QUESTIONS ABOUT YOUR PRE OP INSTRUCTIONS PLEASE CALL [phone]   [place] - Preparing for Surgery Before surgery, you can play an important role.  Because skin is not sterile, your skin needs to be as free of germs as possible.  You can reduce the number of germs on your skin by washing with CHG (chlorahexidine gluconate) soap before surgery.  CHG is an antiseptic cleaner which kills germs and bonds with the skin to continue killing germs even after washing. Please DO NOT use if you have an allergy to CHG or antibacterial soaps.  If your skin becomes reddened/irritated stop using the CHG and inform your nurse when you arrive at Short Stay. Do not shave (including legs and underarms) for at least 48 hours prior to the  first CHG shower.  You may shave your face/neck.  Please follow these instructions carefully:  1.  Shower with CHG Soap the night before surgery and the  morning of surgery.  2.  If you choose to wash your hair, wash your hair first as usual with your normal  shampoo.  3.  After you shampoo, rinse your hair and body thoroughly to remove the shampoo.                             4.  Use CHG as you would any other liquid soap.  You can apply chg directly to the skin and wash.  Gently with a scrungie or clean washcloth.  5.  Apply the CHG Soap to your body ONLY FROM THE NECK DOWN.   Do   not use on face/ open  Wound or open sores. Avoid contact with eyes, ears mouth and   genitals (private parts).                       Wash face,  Genitals (private parts) with your normal soap.             6.  Wash thoroughly, paying special attention to the area where your    surgery  will be performed.  7.  Thoroughly rinse your body with warm water from the neck down.  8.  DO NOT shower/wash with your normal soap after using and rinsing off the CHG Soap.                9.  Pat yourself dry with a clean towel.            10.  Wear clean pajamas.            11.  Place clean sheets on your bed the night of your first shower and do not  sleep with pets. Day of Surgery : Do not apply any lotions/deodorants the morning of surgery.  Please wear clean clothes to the hospital/surgery center.  FAILURE TO FOLLOW THESE INSTRUCTIONS MAY RESULT IN THE CANCELLATION OF YOUR SURGERY  PATIENT SIGNATURE_________________________________  NURSE SIGNATURE__________________________________  ________________________________________________________________________

## 2021-06-29 ENCOUNTER — Encounter: Payer: Self-pay | Admitting: Orthopedic Surgery

## 2021-06-29 ENCOUNTER — Ambulatory Visit: Payer: Medicare Other | Admitting: Orthopedic Surgery

## 2021-06-29 NOTE — H&P (Addendum)
TOTAL KNEE ADMISSION H&P  Patient is being admitted for right total knee arthroplasty.  Subjective:  Chief Complaint:right knee pain.  HPI: Edwin Hudson, 65 y.o. male, has a history of pain and functional disability in the right knee due to arthritis and has failed non-surgical conservative treatments for greater than 12 weeks to includeNSAID's and/or analgesics, corticosteriod injections, and activity modification.  Onset of symptoms was gradual, starting 3 years ago with rapidlly worsening course since that time. The patient noted prior procedures on the knee to include  arthroscopy on the right knee(s).  Patient currently rates pain in the right knee(s) at 7 out of 10 with activity. Patient has night pain.  Patient has evidence of subchondral cysts, subchondral sclerosis, periarticular osteophytes, joint subluxation, and joint space narrowing by imaging studies.. There is no active infection.  Patient Active Problem List   Diagnosis Date Noted   Unilateral primary osteoarthritis, right knee 06/01/2021   Acute respiratory failure with hypoxia ([place]) 02/12/2021   Syncope and collapse 02/12/2021   Effusion, right knee 02/12/2021   COPD (chronic obstructive pulmonary disease) with emphysema ([place]) 02/12/2021   Tobacco abuse 02/12/2021   Alcohol use 02/12/2021   Low back pain 12/14/2020   Primary osteoarthritis of left knee 09/25/2016   S/P total knee replacement using cement, left 09/25/2016   Gout 07/29/2012   Past Medical History:  Diagnosis Date   Arthritis    Cancer ([place])    bladder cancer   COPD (chronic obstructive pulmonary disease) ([place])    Gout     Past Surgical History:  Procedure Laterality Date   COLONOSCOPY     KNEE SURGERY Right 1990   teeth removal     TOTAL KNEE ARTHROPLASTY Left 09/25/2016   Procedure: TOTAL KNEE ARTHROPLASTY;  Surgeon: Garald Balding, MD;  Location: [facility];  Service: Orthopedics;  Laterality: Left;  block done per Dr. Linna Caprice at [phone]    No  current facility-administered medications for this encounter.   Current Outpatient Medications  Medication Sig Dispense Refill Last Dose   acetaminophen (TYLENOL) 500 MG tablet Take 1,000 mg by mouth every 6 (six) hours as needed for moderate pain.      allopurinol (ZYLOPRIM) 100 MG tablet Take 200 mg by mouth daily.      Multiple Vitamins-Minerals (CENTRUM SILVER 50+MEN) TABS Take 1 tablet by mouth daily.      folic acid (FOLVITE) 1 MG tablet Take 1 tablet (1 mg total) by mouth daily. (Patient not taking: No sig reported) 30 tablet 0 Not Taking   HYDROcodone-acetaminophen (NORCO) 5-325 MG tablet Take 1 tablet by mouth every 4 (four) hours as needed for moderate pain. (Patient not taking: Reported on 06/21/2021) 30 tablet 0 Not Taking   thiamine 100 MG tablet Take 1 tablet (100 mg total) by mouth daily. (Patient not taking: No sig reported) 30 tablet 0 Not Taking   traMADol (ULTRAM) 50 MG tablet Take 1 tablet (50 mg total) by mouth 2 (two) times daily. (Patient not taking: Reported on 06/21/2021) 40 tablet 0 Not Taking   traMADol (ULTRAM) 50 MG tablet Take 1 tablet (50 mg total) by mouth 3 (three) times daily as needed. (Patient not taking: No sig reported) 30 tablet 0 Not Taking   traMADol (ULTRAM) 50 MG tablet Take 1 tablet (50 mg total) by mouth every 12 (twelve) hours as needed. (Patient not taking: No sig reported) 30 tablet 0 Not Taking   No Known Allergies  Social History   Tobacco Use  Smoking status: Every Day    Packs/day: 0.75    Years: 40.00    Pack years: 30.00    Types: Cigarettes   Smokeless tobacco: Never  Substance Use Topics   Alcohol use: Yes    Alcohol/week: 14.0 standard drinks    Types: 14 Standard drinks or equivalent per week    Comment: usually on weekends    FAMILTY HISTORY UNREMARKABLE  Review of Systems  All other systems reviewed and are negative.  Objective:  Physical Exam Vitals reviewed.  Constitutional:      Appearance: Normal appearance. He is  normal weight.  HENT:     Head: Normocephalic.     Right Ear: External ear normal.     Left Ear: External ear normal.     Nose: Nose normal.     Mouth/Throat:     Mouth: Mucous membranes are moist.  Cardiovascular:     Rate and Rhythm: Normal rate and regular rhythm.     Pulses: Normal pulses.     Heart sounds: Normal heart sounds.  Pulmonary:     Effort: Pulmonary effort is normal.     Breath sounds: Normal breath sounds.  Abdominal:     General: Abdomen is flat. Bowel sounds are normal.     Palpations: Abdomen is soft.  Musculoskeletal:     Cervical back: Neck supple.  Skin:    General: Skin is warm.  Neurological:     General: No focal deficit present.     Mental Status: He is alert and oriented to person, place, and time.  Psychiatric:        Mood and Affect: Mood normal.        Behavior: Behavior normal.        Thought Content: Thought content normal.        Judgment: Judgment normal.    Vital signs in last 24 hours: BP 126/66 PULSE 64 TEMP 97.11F O2 95%    Labs:   Estimated body mass index is 22.15 kg/m as calculated from the following:   Height as of 06/28/21: 5\' 9"  (1.753 m).   Weight as of 06/28/21: 68 kg.   Imaging Review Plain radiographs demonstrate moderate degenerative joint disease of the right knee(s). The overall alignment ismild valgus. The bone quality appears to be good for age and reported activity level.      Assessment/Plan:  End stage arthritis, right knee   The patient history, physical examination, clinical judgment of the provider and imaging studies are consistent with end stage degenerative joint disease of the right knee(s) and total knee arthroplasty is deemed medically necessary. The treatment options including medical management, injection therapy arthroscopy and arthroplasty were discussed at length. The risks and benefits of total knee arthroplasty were presented and reviewed. The risks due to aseptic loosening, infection,  stiffness, patella tracking problems, thromboembolic complications and other imponderables were discussed. The patient acknowledged the explanation, agreed to proceed with the plan and consent was signed. Patient is being admitted for inpatient treatment for surgery, pain control, PT, OT, prophylactic antibiotics, VTE prophylaxis, progressive ambulation and ADL's and discharge planning. The patient is planning to be discharged home with home health services     Patient's anticipated LOS is less than 2 midnights, meeting these requirements: - Younger than 33 - Lives within 1 hour of care - Has a competent adult at home to recover with post-op recover - NO history of  - Chronic pain requiring opiods  - Diabetes  - Coronary Artery  Disease  - Heart failure  - Heart attack  - Stroke  - DVT/VTE  - Cardiac arrhythmia  - Respiratory Failure/COPD  - Renal failure  - Anemia  - Advanced Liver disease

## 2021-06-29 NOTE — Progress Notes (Signed)
Anesthesia Chart Review:   Case: [id number] Date/Time: 07/04/21 0700   Procedure: RIGHT TOTAL KNEE ARTHROPLASTY (Right: Knee)   Anesthesia type: Choice   Pre-op diagnosis: right knee osteoarthritis   Location: [facility] ROOM 04 / [facility]   Surgeons: Garald Balding, MD       DISCUSSION: Pt is 65 years old with hx COPD, current smoker, history of alcohol abuse.   Hospitalized 2/19-21/22 for syncopal episode that responded to narcan administration. Complicated by acute hypoxemic respiratory failure. Urine drug screen negative. Syncope thought related to hypoxia due to underlying COPD.    VS: BP 114/76   Pulse 61   Temp 36.7 C (Oral)   Resp 20   Ht 5\' 9"  (1.753 m)   Wt 68 kg   SpO2 97%   BMI 22.15 kg/m   PROVIDERS: - PCP is Reita May, NP   LABS: Labs reviewed: Acceptable for surgery. (all labs ordered are listed, but only abnormal results are displayed)  Labs Reviewed  BASIC METABOLIC PANEL - Abnormal; Notable for the following components:      Result Value   Glucose, Bld 100 (*)    All other components within normal limits  CBC - Abnormal; Notable for the following components:   WBC 10.7 (*)    All other components within normal limits  SURGICAL PCR SCREEN    IMAGES: CT angio chest 02/11/21:  1. No evidence of pulmonary embolism. 2. Moderate severity bilateral lower lobe fibrotic changes with mild bibasilar atelectasis. 3. Emphysema and aortic atherosclerosis.  1 view CXR 02/11/21:  - Chronic appearing increased interstitial lung markings with mild bibasilar atelectasis and/or early infiltrate.    EKG 02/11/21:  Sinus rhythm Atrial premature complex Probable left atrial enlargement Right axis deviation   CV: Echo 02/12/21:  1. Left ventricular ejection fraction, by estimation, is 60 to 65%. The left ventricle has normal function. The left ventricle has no regional wall motion abnormalities. The left ventricular internal cavity size was mildly dilated. There  is mild concentric left ventricular hypertrophy. Left ventricular diastolic  parameters are consistent with Grade I diastolic dysfunction (impaired  relaxation).   2. Right ventricular systolic function is normal. The right ventricular size is mildly enlarged.   3. Left atrial size was mildly dilated.   4. Right atrial size was mildly dilated.   5. The mitral valve is normal in structure. Mild mitral valve regurgitation. No evidence of mitral stenosis.   6. The aortic valve is normal in structure. Aortic valve regurgitation is not visualized. No aortic stenosis is present.   7. The inferior vena cava is normal in size with greater than 50% respiratory variability, suggesting right atrial pressure of 3 mmHg.    Past Medical History:  Diagnosis Date   Arthritis    Cancer ([place])    bladder cancer   COPD (chronic obstructive pulmonary disease) ([place])    Gout     Past Surgical History:  Procedure Laterality Date   COLONOSCOPY     KNEE SURGERY Right 1990   teeth removal     TOTAL KNEE ARTHROPLASTY Left 09/25/2016   Procedure: TOTAL KNEE ARTHROPLASTY;  Surgeon: Garald Balding, MD;  Location: [place];  Service: Orthopedics;  Laterality: Left;  block done per Dr. Linna Caprice at [place]:  acetaminophen (TYLENOL) 500 MG tablet   allopurinol (ZYLOPRIM) 371 MG tablet   folic acid (FOLVITE) 1 MG tablet   HYDROcodone-acetaminophen (NORCO) 5-325 MG tablet   Multiple Vitamins-Minerals (CENTRUM  SILVER 50+MEN) TABS   thiamine 100 MG tablet   traMADol (ULTRAM) 50 MG tablet   traMADol (ULTRAM) 50 MG tablet   traMADol (ULTRAM) 50 MG tablet   No current facility-administered medications for this encounter.    If no changes, I anticipate pt can proceed with surgery as scheduled.   Willeen Cass, PhD, FNP-BC [facility]/Anesthesiology Phone: [phone] 06/29/2021 4:27 PM

## 2021-06-29 NOTE — Anesthesia Preprocedure Evaluation (Addendum)
Anesthesia Evaluation  Patient identified by MRN, date of birth, ID band Patient awake    Reviewed: Allergy & Precautions, NPO status , Patient's Chart, lab work & pertinent test results  History of Anesthesia Complications Negative for: history of anesthetic complications  Airway Mallampati: II  TM Distance: >3 FB Neck ROM: Full    Dental  (+) Edentulous Upper, Edentulous Lower   Pulmonary COPD, Current Smoker and Patient abstained from smoking.,    Pulmonary exam normal        Cardiovascular negative cardio ROS Normal cardiovascular exam     Neuro/Psych negative neurological ROS  negative psych ROS   GI/Hepatic negative GI ROS, (+)     substance abuse  alcohol use,   Endo/Other  negative endocrine ROS  Renal/GU negative Renal ROS    Bladder cancer     Musculoskeletal  (+) Arthritis ,  Gout    Abdominal   Peds  Hematology negative hematology ROS (+)  Plt 240k    Anesthesia Other Findings Covid test negative   Reproductive/Obstetrics                           Anesthesia Physical Anesthesia Plan  ASA: 3  Anesthesia Plan: Spinal   Post-op Pain Management:  Regional for Post-op pain   Induction:   PONV Risk Score and Plan: 1 and Treatment may vary due to age or medical condition and Propofol infusion  Airway Management Planned: Natural Airway and Simple Face Mask  Additional Equipment: None  Intra-op Plan:   Post-operative Plan:   Informed Consent: I have reviewed the patients History and Physical, chart, labs and discussed the procedure including the risks, benefits and alternatives for the proposed anesthesia with the patient or authorized representative who has indicated his/her understanding and acceptance.       Plan Discussed with: CRNA and Anesthesiologist  Anesthesia Plan Comments:       Anesthesia Quick Evaluation

## 2021-06-30 ENCOUNTER — Other Ambulatory Visit (HOSPITAL_COMMUNITY)
Admission: RE | Admit: 2021-06-30 | Discharge: 2021-06-30 | Disposition: A | Discharge status: Home / Self Care | Payer: Medicare Other | Source: Ambulatory Visit | Attending: Orthopaedic Surgery | Admitting: Orthopaedic Surgery

## 2021-06-30 DIAGNOSIS — Z20822 Contact with and (suspected) exposure to covid-19: Secondary | ICD-10-CM | POA: Diagnosis not present

## 2021-06-30 DIAGNOSIS — Z01812 Encounter for preprocedural laboratory examination: Secondary | ICD-10-CM | POA: Diagnosis present

## 2021-06-30 LAB — SARS CORONAVIRUS 2 (TAT 6-24 HRS): SARS Coronavirus 2: NEGATIVE

## 2021-07-04 ENCOUNTER — Other Ambulatory Visit: Payer: Self-pay

## 2021-07-04 ENCOUNTER — Encounter (HOSPITAL_COMMUNITY): Payer: Self-pay | Admitting: Orthopaedic Surgery

## 2021-07-04 ENCOUNTER — Encounter (HOSPITAL_COMMUNITY)
Admission: RE | Disposition: A | Discharge status: Home / Self Care | Payer: Self-pay | Source: Home / Self Care | Attending: Orthopaedic Surgery

## 2021-07-04 ENCOUNTER — Ambulatory Visit (HOSPITAL_COMMUNITY): Payer: Medicare Other | Admitting: Certified Registered Nurse Anesthetist

## 2021-07-04 ENCOUNTER — Ambulatory Visit (HOSPITAL_COMMUNITY): Payer: Medicare Other | Admitting: Emergency Medicine

## 2021-07-04 ENCOUNTER — Observation Stay (HOSPITAL_COMMUNITY)
Admission: RE | Admit: 2021-07-04 | Discharge: 2021-07-05 | Disposition: A | Discharge status: Home / Self Care | Payer: Medicare Other | Attending: Orthopaedic Surgery | Admitting: Orthopaedic Surgery

## 2021-07-04 DIAGNOSIS — F17210 Nicotine dependence, cigarettes, uncomplicated: Secondary | ICD-10-CM | POA: Diagnosis not present

## 2021-07-04 DIAGNOSIS — Z96652 Presence of left artificial knee joint: Secondary | ICD-10-CM | POA: Insufficient documentation

## 2021-07-04 DIAGNOSIS — Z79899 Other long term (current) drug therapy: Secondary | ICD-10-CM | POA: Insufficient documentation

## 2021-07-04 DIAGNOSIS — J449 Chronic obstructive pulmonary disease, unspecified: Secondary | ICD-10-CM | POA: Insufficient documentation

## 2021-07-04 DIAGNOSIS — Z8551 Personal history of malignant neoplasm of bladder: Secondary | ICD-10-CM | POA: Diagnosis not present

## 2021-07-04 DIAGNOSIS — Z01818 Encounter for other preprocedural examination: Secondary | ICD-10-CM

## 2021-07-04 DIAGNOSIS — M1711 Unilateral primary osteoarthritis, right knee: Secondary | ICD-10-CM | POA: Diagnosis not present

## 2021-07-04 DIAGNOSIS — Z96651 Presence of right artificial knee joint: Secondary | ICD-10-CM

## 2021-07-04 HISTORY — PX: TOTAL KNEE ARTHROPLASTY: SHX125

## 2021-07-04 LAB — COMPREHENSIVE METABOLIC PANEL
ALT: 14 U/L (ref 0–44)
AST: 17 U/L (ref 15–41)
Albumin: 3.6 g/dL (ref 3.5–5.0)
Alkaline Phosphatase: 61 U/L (ref 38–126)
Anion gap: 9 (ref 5–15)
BUN: 10 mg/dL (ref 8–23)
CO2: 26 mmol/L (ref 22–32)
Calcium: 8.9 mg/dL (ref 8.9–10.3)
Chloride: 102 mmol/L (ref 98–111)
Creatinine, Ser: 0.81 mg/dL (ref 0.61–1.24)
GFR, Estimated: >60 mL/min (ref >60)
Glucose, Bld: 152 mg/dL — ABNORMAL HIGH (ref 70–99)
Potassium: 4.1 mmol/L (ref 3.5–5.1)
Sodium: 137 mmol/L (ref 135–145)
Total Bilirubin: 0.7 mg/dL (ref 0.3–1.2)
Total Protein: 6.6 g/dL (ref 6.5–8.1)

## 2021-07-04 LAB — CBC WITH DIFFERENTIAL/PLATELET
Abs Immature Granulocytes: 0.02 10*3/uL (ref 0.00–0.07)
Basophils Absolute: 0.1 10*3/uL (ref 0.0–0.1)
Basophils Relative: 1 %
Eosinophils Absolute: 0.1 10*3/uL (ref 0.0–0.5)
Eosinophils Relative: 2 %
HCT: 37.2 % — ABNORMAL LOW (ref 39.0–52.0)
Hemoglobin: 12.2 g/dL — ABNORMAL LOW (ref 13.0–17.0)
Immature Granulocytes: 0 %
Lymphocytes Relative: 12 %
Lymphs Abs: 1 10*3/uL (ref 0.7–4.0)
MCH: 32.7 pg (ref 26.0–34.0)
MCHC: 32.8 g/dL (ref 30.0–36.0)
MCV: 99.7 fL (ref 80.0–100.0)
Monocytes Absolute: 0.2 10*3/uL (ref 0.1–1.0)
Monocytes Relative: 2 %
Neutro Abs: 6.9 10*3/uL (ref 1.7–7.7)
Neutrophils Relative %: 83 %
Platelets: 174 10*3/uL (ref 150–400)
RBC: 3.73 MIL/uL — ABNORMAL LOW (ref 4.22–5.81)
RDW: 13.2 % (ref 11.5–15.5)
WBC: 8.2 10*3/uL (ref 4.0–10.5)
nRBC: 0 % (ref 0.0–0.2)

## 2021-07-04 SURGERY — ARTHROPLASTY, KNEE, TOTAL
Anesthesia: Spinal | Site: Knee | Laterality: Right

## 2021-07-04 MED ORDER — SODIUM CHLORIDE 0.9 % IV SOLN: INTRAVENOUS | Status: DC

## 2021-07-04 MED ORDER — EPHEDRINE 5 MG/ML INJ
INTRAVENOUS | Status: AC
  Filled 2021-07-04: qty 10

## 2021-07-04 MED ORDER — DEXAMETHASONE SODIUM PHOSPHATE 10 MG/ML IJ SOLN
INTRAMUSCULAR | Status: DC | PRN
  Administered 2021-07-04 (×2): 10 mg via INTRAVENOUS

## 2021-07-04 MED ORDER — METHOCARBAMOL 500 MG IVPB - SIMPLE MED
INTRAVENOUS | Status: AC
  Filled 2021-07-04: qty 50

## 2021-07-04 MED ORDER — LACTATED RINGERS IV SOLN
INTRAVENOUS | Status: DC
  Administered 2021-07-04: 1000 mL via INTRAVENOUS

## 2021-07-04 MED ORDER — ACETAMINOPHEN 500 MG PO TABS
1000.0000 mg | ORAL_TABLET | Freq: Once | ORAL | Status: AC
  Administered 2021-07-04: 1000 mg via ORAL
  Filled 2021-07-04: qty 2

## 2021-07-04 MED ORDER — PHENOL 1.4 % MT LIQD: 1.0000 | OROMUCOSAL | Status: DC | PRN

## 2021-07-04 MED ORDER — METHOCARBAMOL 500 MG IVPB - SIMPLE MED
500.0000 mg | Freq: Four times a day (QID) | INTRAVENOUS | Status: DC | PRN
  Administered 2021-07-04: 500 mg via INTRAVENOUS
  Filled 2021-07-04: qty 50

## 2021-07-04 MED ORDER — METOCLOPRAMIDE HCL 5 MG PO TABS
5.0000 mg | ORAL_TABLET | Freq: Three times a day (TID) | ORAL | Status: DC | PRN
  Filled 2021-07-04: qty 2

## 2021-07-04 MED ORDER — OXYCODONE HCL 5 MG/5ML PO SOLN: 5.0000 mg | Freq: Once | ORAL | Status: AC | PRN

## 2021-07-04 MED ORDER — PROPOFOL 1000 MG/100ML IV EMUL
INTRAVENOUS | Status: AC
  Filled 2021-07-04: qty 100

## 2021-07-04 MED ORDER — MIDAZOLAM HCL 5 MG/5ML IJ SOLN
INTRAMUSCULAR | Status: DC | PRN
  Administered 2021-07-04: 2 mg via INTRAVENOUS

## 2021-07-04 MED ORDER — OXYCODONE HCL 5 MG PO TABS
5.0000 mg | ORAL_TABLET | Freq: Once | ORAL | Status: AC | PRN
Start: 2021-07-04 — End: 2021-07-04
  Administered 2021-07-04: 5 mg via ORAL

## 2021-07-04 MED ORDER — SODIUM CHLORIDE 0.9 % IV SOLN
75.0000 mL/h | INTRAVENOUS | Status: DC
  Administered 2021-07-04: 75 mL/h via INTRAVENOUS

## 2021-07-04 MED ORDER — LIDOCAINE 2% (20 MG/ML) 5 ML SYRINGE
INTRAMUSCULAR | Status: AC
  Filled 2021-07-04: qty 5

## 2021-07-04 MED ORDER — POVIDONE-IODINE 10 % EX SWAB: 2.0000 "application " | Freq: Once | CUTANEOUS | 12 refills | Status: AC

## 2021-07-04 MED ORDER — ASPIRIN EC 81 MG PO TBEC: 81.0000 mg | DELAYED_RELEASE_TABLET | Freq: Two times a day (BID) | ORAL | 2 refills | Status: AC

## 2021-07-04 MED ORDER — PROPOFOL 500 MG/50ML IV EMUL
INTRAVENOUS | Status: DC | PRN
  Administered 2021-07-04: 75 ug/kg/min via INTRAVENOUS

## 2021-07-04 MED ORDER — HYDROMORPHONE HCL 1 MG/ML IJ SOLN
INTRAMUSCULAR | Status: AC
  Filled 2021-07-04: qty 1

## 2021-07-04 MED ORDER — FENTANYL CITRATE (PF) 100 MCG/2ML IJ SOLN
INTRAMUSCULAR | Status: AC
  Filled 2021-07-04: qty 2

## 2021-07-04 MED ORDER — POVIDONE-IODINE 10 % EX SWAB: 2.0000 "application " | Freq: Once | CUTANEOUS | Status: DC

## 2021-07-04 MED ORDER — ONDANSETRON HCL 4 MG/2ML IJ SOLN
INTRAMUSCULAR | Status: DC | PRN
  Administered 2021-07-04: 4 mg via INTRAVENOUS

## 2021-07-04 MED ORDER — PHENYLEPHRINE 40 MCG/ML (10ML) SYRINGE FOR IV PUSH (FOR BLOOD PRESSURE SUPPORT)
PREFILLED_SYRINGE | INTRAVENOUS | Status: AC
  Filled 2021-07-04: qty 10

## 2021-07-04 MED ORDER — METHOCARBAMOL 500 MG PO TABS: 500.0000 mg | ORAL_TABLET | Freq: Three times a day (TID) | ORAL | 0 refills | Status: AC | PRN

## 2021-07-04 MED ORDER — LACTATED RINGERS IV BOLUS: 250.0000 mL | Freq: Once | INTRAVENOUS | Status: DC

## 2021-07-04 MED ORDER — ROPIVACAINE HCL 7.5 MG/ML IJ SOLN
INTRAMUSCULAR | Status: DC | PRN
  Administered 2021-07-04: 20 mL via PERINEURAL

## 2021-07-04 MED ORDER — OXYCODONE HCL 5 MG PO TABS
5.0000 mg | ORAL_TABLET | Freq: Once | ORAL | Status: AC
Start: 2021-07-04 — End: 2021-07-04
  Administered 2021-07-04: 5 mg via ORAL

## 2021-07-04 MED ORDER — ONDANSETRON HCL 4 MG PO TABS
4.0000 mg | ORAL_TABLET | Freq: Four times a day (QID) | ORAL | Status: DC | PRN
  Filled 2021-07-04: qty 1

## 2021-07-04 MED ORDER — DIPHENHYDRAMINE HCL 12.5 MG/5ML PO ELIX: 12.5000 mg | ORAL_SOLUTION | ORAL | Status: DC | PRN

## 2021-07-04 MED ORDER — DOCUSATE SODIUM 100 MG PO CAPS
100.0000 mg | ORAL_CAPSULE | Freq: Two times a day (BID) | ORAL | Status: DC
  Administered 2021-07-04 – 2021-07-05 (×2): 100 mg via ORAL
  Filled 2021-07-04 (×2): qty 1

## 2021-07-04 MED ORDER — METHOCARBAMOL 500 MG PO TABS
500.0000 mg | ORAL_TABLET | Freq: Four times a day (QID) | ORAL | Status: DC | PRN
  Administered 2021-07-04 – 2021-07-05 (×3): 500 mg via ORAL
  Filled 2021-07-04 (×3): qty 1

## 2021-07-04 MED ORDER — CEFAZOLIN SODIUM-DEXTROSE 1-4 GM/50ML-% IV SOLN
1.0000 g | Freq: Four times a day (QID) | INTRAVENOUS | Status: AC
  Administered 2021-07-04 (×2): 1 g via INTRAVENOUS
  Filled 2021-07-04 (×2): qty 50

## 2021-07-04 MED ORDER — ONDANSETRON HCL 4 MG/2ML IJ SOLN: 4.0000 mg | Freq: Four times a day (QID) | INTRAMUSCULAR | Status: DC | PRN

## 2021-07-04 MED ORDER — CHLORHEXIDINE GLUCONATE 0.12 % MT SOLN
15.0000 mL | Freq: Once | OROMUCOSAL | Status: AC
  Administered 2021-07-04: 15 mL via OROMUCOSAL

## 2021-07-04 MED ORDER — BUPIVACAINE-EPINEPHRINE (PF) 0.5% -1:200000 IJ SOLN
INTRAMUSCULAR | Status: AC
  Filled 2021-07-04: qty 30

## 2021-07-04 MED ORDER — OXYCODONE HCL 5 MG PO TABS
5.0000 mg | ORAL_TABLET | ORAL | Status: DC | PRN
  Administered 2021-07-04 – 2021-07-05 (×4): 10 mg via ORAL
  Filled 2021-07-04 (×4): qty 2

## 2021-07-04 MED ORDER — OXYCODONE HCL 5 MG PO TABS
ORAL_TABLET | ORAL | Status: AC
  Filled 2021-07-04: qty 1

## 2021-07-04 MED ORDER — ORAL CARE MOUTH RINSE: 15.0000 mL | Freq: Once | OROMUCOSAL | Status: AC

## 2021-07-04 MED ORDER — FENTANYL CITRATE (PF) 100 MCG/2ML IJ SOLN
INTRAMUSCULAR | Status: DC | PRN
  Administered 2021-07-04 (×2): 50 ug via INTRAVENOUS

## 2021-07-04 MED ORDER — HYDROMORPHONE HCL 1 MG/ML IJ SOLN
0.5000 mg | INTRAMUSCULAR | Status: DC | PRN
  Administered 2021-07-04: 1 mg via INTRAVENOUS

## 2021-07-04 MED ORDER — FENTANYL CITRATE (PF) 100 MCG/2ML IJ SOLN
25.0000 ug | INTRAMUSCULAR | Status: DC | PRN
  Administered 2021-07-04 (×3): 50 ug via INTRAVENOUS

## 2021-07-04 MED ORDER — BISACODYL 10 MG RE SUPP: 10.0000 mg | Freq: Every day | RECTAL | Status: DC | PRN

## 2021-07-04 MED ORDER — POVIDONE-IODINE 10 % EX SWAB
2.0000 "application " | Freq: Once | CUTANEOUS | Status: AC
  Administered 2021-07-04: 2 via TOPICAL

## 2021-07-04 MED ORDER — MIDAZOLAM HCL 2 MG/2ML IJ SOLN
INTRAMUSCULAR | Status: AC
  Filled 2021-07-04: qty 2

## 2021-07-04 MED ORDER — LIDOCAINE 2% (20 MG/ML) 5 ML SYRINGE
INTRAMUSCULAR | Status: DC | PRN
  Administered 2021-07-04: 100 mg via INTRAVENOUS

## 2021-07-04 MED ORDER — MAGNESIUM CITRATE PO SOLN: 1.0000 | Freq: Once | ORAL | Status: DC | PRN

## 2021-07-04 MED ORDER — METOCLOPRAMIDE HCL 5 MG/ML IJ SOLN: 5.0000 mg | Freq: Three times a day (TID) | INTRAMUSCULAR | Status: DC | PRN

## 2021-07-04 MED ORDER — ACETAMINOPHEN 500 MG PO TABS
1000.0000 mg | ORAL_TABLET | Freq: Four times a day (QID) | ORAL | Status: AC
  Administered 2021-07-04 – 2021-07-05 (×4): 1000 mg via ORAL
  Filled 2021-07-04 (×4): qty 2

## 2021-07-04 MED ORDER — EPHEDRINE SULFATE-NACL 50-0.9 MG/10ML-% IV SOSY
PREFILLED_SYRINGE | INTRAVENOUS | Status: DC | PRN
  Administered 2021-07-04: 10 mg via INTRAVENOUS

## 2021-07-04 MED ORDER — ONDANSETRON HCL 4 MG/2ML IJ SOLN
INTRAMUSCULAR | Status: AC
  Filled 2021-07-04: qty 2

## 2021-07-04 MED ORDER — HYDROMORPHONE HCL 1 MG/ML IJ SOLN: 0.5000 mg | Freq: Once | INTRAMUSCULAR | Status: DC | PRN

## 2021-07-04 MED ORDER — ONDANSETRON HCL 4 MG/2ML IJ SOLN: 4.0000 mg | Freq: Once | INTRAMUSCULAR | Status: DC | PRN

## 2021-07-04 MED ORDER — BUPIVACAINE-EPINEPHRINE 0.5% -1:200000 IJ SOLN
INTRAMUSCULAR | Status: DC | PRN
  Administered 2021-07-04: 30 mL

## 2021-07-04 MED ORDER — PROPOFOL 10 MG/ML IV BOLUS
INTRAVENOUS | Status: DC | PRN
  Administered 2021-07-04 (×4): 20 mg via INTRAVENOUS

## 2021-07-04 MED ORDER — MENTHOL 3 MG MT LOZG: 1.0000 | LOZENGE | OROMUCOSAL | Status: DC | PRN

## 2021-07-04 MED ORDER — DEXAMETHASONE SODIUM PHOSPHATE 10 MG/ML IJ SOLN
INTRAMUSCULAR | Status: AC
  Filled 2021-07-04: qty 1

## 2021-07-04 MED ORDER — OXYCODONE HCL 5 MG PO TABS: 5.0000 mg | ORAL_TABLET | Freq: Three times a day (TID) | ORAL | 0 refills | Status: AC | PRN

## 2021-07-04 MED ORDER — SODIUM CHLORIDE 0.9 % IV SOLN
2.0000 g | INTRAVENOUS | Status: AC
  Administered 2021-07-04: 2 g via INTRAVENOUS
  Filled 2021-07-04: qty 2

## 2021-07-04 MED ORDER — TRANEXAMIC ACID-NACL 1000-0.7 MG/100ML-% IV SOLN
1000.0000 mg | INTRAVENOUS | Status: AC
  Administered 2021-07-04: 1000 mg via INTRAVENOUS
  Filled 2021-07-04: qty 100

## 2021-07-04 MED ORDER — POVIDONE-IODINE 7.5 % EX SOLN: Freq: Once | CUTANEOUS | Status: DC

## 2021-07-04 MED ORDER — LACTATED RINGERS IV BOLUS
500.0000 mL | Freq: Once | INTRAVENOUS | Status: AC
  Administered 2021-07-04: 500 mL via INTRAVENOUS

## 2021-07-04 MED ORDER — MAGNESIUM HYDROXIDE 400 MG/5ML PO SUSP: 30.0000 mL | Freq: Every day | ORAL | Status: DC | PRN

## 2021-07-04 MED ORDER — ASPIRIN 81 MG PO CHEW
81.0000 mg | CHEWABLE_TABLET | Freq: Two times a day (BID) | ORAL | Status: DC
  Administered 2021-07-04 – 2021-07-05 (×2): 81 mg via ORAL
  Filled 2021-07-04 (×2): qty 1

## 2021-07-04 MED ORDER — ALUM & MAG HYDROXIDE-SIMETH 200-200-20 MG/5ML PO SUSP: 30.0000 mL | ORAL | Status: DC | PRN

## 2021-07-04 MED ORDER — SODIUM CHLORIDE 0.9 % IR SOLN
Status: DC | PRN
  Administered 2021-07-04: 1000 mL

## 2021-07-04 MED ORDER — 0.9 % SODIUM CHLORIDE (POUR BTL) OPTIME
TOPICAL | Status: DC | PRN
  Administered 2021-07-04: 1000 mL

## 2021-07-04 SURGICAL SUPPLY — 57 items
BAG COUNTER SPONGE SURGICOUNT (BAG) IMPLANT
BAG DECANTER FOR FLEXI CONT (MISCELLANEOUS) ×1 IMPLANT
BAG SPEC THK2 15X12 ZIP CLS (MISCELLANEOUS) ×1
BAG SPNG CNTER NS LX DISP (BAG)
BAG ZIPLOCK 12X15 (MISCELLANEOUS) ×2 IMPLANT
BLADE SAGITTAL 25.0X1.19X90 (BLADE) ×2 IMPLANT
BOWL SMART MIX CTS (DISPOSABLE) ×2 IMPLANT
CEMENT HV SMART SET (Cement) ×4 IMPLANT
CEMENT TIBIA MBT SIZE 4 (Knees) IMPLANT
COMP FEM CEM STD RT LCS (Orthopedic Implant) ×2 IMPLANT
COMP PATELLA PEGX3 CEM STAN (Knees) ×2 IMPLANT
COMPONENT FEM CEM STD RT LCS (Orthopedic Implant) IMPLANT
COMPONENT PTLLA PEGX3 CEM STAN (Knees) IMPLANT
COVER SURGICAL LIGHT HANDLE (MISCELLANEOUS) ×2 IMPLANT
CUFF TOURN SGL QUICK 34 (TOURNIQUET CUFF) ×2
CUFF TRNQT CYL 34X4.125X (TOURNIQUET CUFF) ×1 IMPLANT
DECANTER SPIKE VIAL GLASS SM (MISCELLANEOUS) ×2 IMPLANT
DRAPE IMP U-DRAPE 54X76 (DRAPES) ×2 IMPLANT
DRAPE ORTHO SPLIT 77X108 STRL (DRAPES)
DRAPE SHEET LG 3/4 BI-LAMINATE (DRAPES) ×4 IMPLANT
DRAPE SURG ORHT 6 SPLT 77X108 (DRAPES) IMPLANT
DRSG ADAPTIC 3X8 NADH LF (GAUZE/BANDAGES/DRESSINGS) ×2 IMPLANT
DRSG AQUACEL AG ADV 3.5X10 (GAUZE/BANDAGES/DRESSINGS) ×1 IMPLANT
DRSG PAD ABDOMINAL 8X10 ST (GAUZE/BANDAGES/DRESSINGS) ×2 IMPLANT
DURAPREP 26ML APPLICATOR (WOUND CARE) ×4 IMPLANT
ELECT REM PT RETURN 15FT ADLT (MISCELLANEOUS) ×2 IMPLANT
GAUZE SPONGE 4X4 12PLY STRL (GAUZE/BANDAGES/DRESSINGS) ×2 IMPLANT
GLOVE SRG 8 PF TXTR STRL LF DI (GLOVE) ×1 IMPLANT
GLOVE SURG LTX SZ8 (GLOVE) ×4 IMPLANT
GLOVE SURG LTX SZ8.5 (GLOVE) ×4 IMPLANT
GLOVE SURG UNDER POLY LF SZ8 (GLOVE) ×2
GLOVE SURG UNDER POLY LF SZ8.5 (GLOVE) ×2 IMPLANT
GOWN STRL REUS W/ TWL LRG LVL3 (GOWN DISPOSABLE) ×1 IMPLANT
GOWN STRL REUS W/TWL 2XL LVL3 (GOWN DISPOSABLE) ×2 IMPLANT
GOWN STRL REUS W/TWL LRG LVL3 (GOWN DISPOSABLE) ×2
HANDPIECE INTERPULSE COAX TIP (DISPOSABLE) ×2
HOLDER FOLEY CATH W/STRAP (MISCELLANEOUS) IMPLANT
INSERT TIB LCS RP STD 12.5 (Knees) ×1 IMPLANT
KIT TURNOVER KIT A (KITS) ×2 IMPLANT
MANIFOLD NEPTUNE II (INSTRUMENTS) ×2 IMPLANT
NS IRRIG 1000ML POUR BTL (IV SOLUTION) ×2 IMPLANT
PACK TOTAL KNEE CUSTOM (KITS) ×2 IMPLANT
PADDING CAST COTTON 6X4 STRL (CAST SUPPLIES) ×4 IMPLANT
PENCIL SMOKE EVACUATOR (MISCELLANEOUS) IMPLANT
PIN STEINMAN FIXATION KNEE (PIN) ×1 IMPLANT
PROTECTOR NERVE ULNAR (MISCELLANEOUS) ×2 IMPLANT
SET HNDPC FAN SPRY TIP SCT (DISPOSABLE) ×1 IMPLANT
STAPLER VISISTAT 35W (STAPLE) ×2 IMPLANT
SUT BONE WAX W31G (SUTURE) ×2 IMPLANT
SUT ETHIBOND NAB CT1 #1 30IN (SUTURE) ×4 IMPLANT
SUT MNCRL AB 3-0 PS2 18 (SUTURE) ×2 IMPLANT
SUT VIC AB 2-0 PS2 27 (SUTURE) ×2 IMPLANT
TIBIA MBT CEMENT SIZE 4 (Knees) ×2 IMPLANT
TRAY FOLEY MTR SLVR 16FR STAT (SET/KITS/TRAYS/PACK) ×2 IMPLANT
UNDERPAD 30X36 HEAVY ABSORB (UNDERPADS AND DIAPERS) ×2 IMPLANT
WATER STERILE IRR 1000ML POUR (IV SOLUTION) ×4 IMPLANT
WRAP KNEE MAXI GEL POST OP (GAUZE/BANDAGES/DRESSINGS) ×2 IMPLANT

## 2021-07-04 NOTE — Op Note (Signed)
NAME: Edwin Hudson, Edwin Hudson MEDICAL RECORD NO: [id number] ACCOUNT NO: [id number] DATE OF BIRTH: May 12, 1956 FACILITY: [facility] LOCATION: [facility] PHYSICIAN: Vonna Kotyk. Durward Fortes, MD  Operative Report   DATE OF PROCEDURE: 07/04/2021  PREOPERATIVE DIAGNOSIS:  End-stage osteoarthritis, right knee.  POSTOPERATIVE DIAGNOSIS:  End-stage osteoarthritis, right knee.  PROCEDURE:  Right total knee replacement.  SURGEON:  Vonna Kotyk. Durward Fortes, MD  ASSISTANT:  Biagio Borg, PA-C  ANESTHESIA:  Spinal with adductor canal block and IV sedation.  COMPLICATIONS:  None.  COMPONENTS:  DePuy LCS standard femoral component, a #4 rotating keeled tibial tray with a 12.5 mm polyethylene bridging bearing and the metal-back 3 peg rotating patella.  Components were secured with polymethyl methacrylate.  DESCRIPTION OF PROCEDURE:  The patient was met in the holding area, identified the right knee as the appropriate operative site, and marked it accordingly.  Anesthesia concurred and performed an adductor canal block.  The patient was then transported to room #4.  Anesthesia performed a spinal anesthetic without difficulty.  He was then placed comfortably in the supine position on the operating table.  Nursing staff inserted a Foley catheter.  Urine was clear.   Tourniquet was applied to the right thigh.  The right lower extremity was then prepared with chlorhexidine scrub and then DuraPrep x2 from the tourniquet to the tips of the toes.  Sterile draping was performed.  A gram of TXA was given by anesthesia as well as 2 grams of IV Ancef.  Timeout was called.  The right lower extremity was then elevated.  Tourniquet was elevated to 325 mmHg.  I performed a slightly curvilinear incision about the knee as the patient had a chronic skin rash and I avoided any lesions by having this particular incision curving a little bit medially.  Via sharp dissection, the incision was carried down to  subcutaneous tissue.  The first  layer of capsule was incised followed by medial parapatellar incision using the Bovie.  The joint was entered.  There was about 15 mL clear yellow joint effusion.  The patella was everted at 180 degrees laterally and the knee flexed to 90 degrees.  There were large osteophytes along the medial and lateral femoral condyle and medial tibial plateau.  These were removed and I measured a standard femoral component.   There was a moderate amount of synovitis, some of which was beefy red.  Synovectomy was performed.  First, a bony cut was then made transversely on the proximal tibia with a 7-degree angle of declination.  After each bony cut on the tibia and the femur, I checked my alignment with the external guide.  Subsequent cuts were then made on the femur using  the standard jig.  I used a 4-degree distal femoral valgus cut.  A finishing guide was then applied to obtain the taper cut in the center hole.  Lamina spreaders were then inserted along the medial and lateral compartments.  I removed medial and lateral menisci as well as ACL and PCL.  There was a moderate sized  popliteal cyst that was decompressed.  Osteophytes were removed from the posterior femoral condyle with a 3/4 inch curved osteotome.  MCL and LCL remained intact throughout the procedure.  I measured a 12.5 mm flexion, extension gap, both of which  appeared to be perfectly symmetrical.  Retractors were then placed around the tibia.  It was advanced anteriorly.  It measured a #4 tibial tray.  This was pinned in place.  The center hole was then made followed  by the keeled cut.  With the tibial trial jig in place, the 12.5 mm polyethylene  bridging bearing was then inserted followed by the trial standard femoral component.  The entire construct was then reduced and through a full range of motion remained perfectly stable.  Had full extension, flexed well over 120 degrees without any  malrotation of the tibia.  Patella was prepared by  removing about 10 mm of patella, leaving 13 mm of patella thickness.  The patellar jig was applied.  I checked my alignment, made the three holes and I then applied the trial patella.  This was reduced and then through a range of  motion, remained stable.  The trial components were removed.  We copiously irrigated the joint with saline solution.  Final components were then impacted with polymethyl methacrylate.  We initially applied the tibial #4 tray followed by the 12.5 mm polyethylene bridging bearing in the standard femoral component.  After reduction, we removed any extraneous methacrylate  from around the periphery of the components.  The patella was applied with methacrylate and a patellar clamp.  At approximately 16 minutes, the methacrylate had matured and harden.  During this time, we performed further synovectomy and injected deep capsule with 0.25% Marcaine with epinephrine.  At about 66 minutes, the tourniquet was released.  We had nice  capillary refill to the operative site.  Any gross bleeding was Bovie coagulated.  We had a nice dry field at that point.  The deep capsule was then closed with a running #1 Ethibond superficial capsule with a 2-0 Vicryl and the subQ with 3-0 Monocryl.  Skin closed with skin clips.  Sterile bulky dressing was applied, i.e., an Aquacel dressing.  The plan was to send the  patient home today pending assessment in the recovery room.  He will have oxycodone for pain, Robaxin as a muscle relaxant, he will take two baby aspirin twice a day.  We will plan to check him in the office in 7-10 days.   NIK D: 07/04/2021 9:25:26 am T: 07/04/2021 10:36:00 am  JOB: [id number]/ [id number]

## 2021-07-04 NOTE — Anesthesia Procedure Notes (Signed)
Anesthesia Regional Block: Adductor canal block   Pre-Anesthetic Checklist: , timeout performed,  Correct Patient, Correct Site, Correct Laterality,  Correct Procedure, Correct Position, site marked,  Risks and benefits discussed,  Surgical consent,  Pre-op evaluation,  At surgeon's request and post-op pain management  Laterality: Right  Prep: chloraprep       Needles:  Injection technique: Single-shot  Needle Type: Echogenic Needle     Needle Length: 10cm  Needle Gauge: 21     Additional Needles:   Narrative:  Start time: 07/04/2021 7:00 AM End time: 07/04/2021 7:03 AM Injection made incrementally with aspirations every 5 mL.  Performed by: Personally  Anesthesiologist: Audry Pili, MD  Additional Notes: No pain on injection. No increased resistance to injection. Injection made in 5cc increments. Good needle visualization. Patient tolerated the procedure well.

## 2021-07-04 NOTE — Anesthesia Postprocedure Evaluation (Signed)
Anesthesia Post Note  Patient: Edwin Hudson  Procedure(s) Performed: RIGHT TOTAL KNEE ARTHROPLASTY (Right: Knee)     Patient location during evaluation: PACU Anesthesia Type: Spinal Level of consciousness: awake and alert Pain management: pain level controlled Vital Signs Assessment: post-procedure vital signs reviewed and stable Respiratory status: spontaneous breathing and respiratory function stable Cardiovascular status: blood pressure returned to baseline and stable Postop Assessment: spinal receding and no apparent nausea or vomiting Anesthetic complications: no   No notable events documented.  Last Vitals:  Vitals:   07/04/21 1000 07/04/21 1015  BP: 115/67   Pulse: (!) 51   Resp: 15   Temp:    SpO2: 95% 92%    Last Pain:  Vitals:   07/04/21 1015  TempSrc:   PainSc: [name]

## 2021-07-04 NOTE — Evaluation (Signed)
Physical Therapy Evaluation Patient Details Name: Edwin Hudson MRN: [id number] DOB: [birth year] Today's Date: 07/04/2021   History of Present Illness   Patient is 65 y.o. male s/p Rt TKA on 07/04/21 with PMH significant for OA, bladder cancer, COPD, gout, Lt TKA in 2017.   Clinical Impression  Edwin Hudson is a 65 y.o. male POD 0 s/p Rt TKA. Patient reports independence with mobility at baseline. Patient is now limited by functional impairments (see PT problem list below) and requires min guard/assist for transfers and gait with RW. Patient was able to ambulate ~130 feet with RW and min guard and cues for safety. Patient completed stair mobility with min guard and cues for safe sequencing and guarding from family. Patient instructed in exercise to facilitate circulation to manage edema and reduce risk of DVT. Patient will benefit from continued skilled PT interventions to address impairments and progress towards PLOF. Acute PT will follow to progress mobility in preparation for safe discharge home.     Follow Up Recommendations  Follow surgeon's recommendation for DC plan and follow-up therapies;Home health PT    Equipment Recommendations    3 in 1   Recommendations for Other Services       Precautions / Restrictions          07/04/21 1200  PT Visit Information  Last PT Received On 07/04/21  Assistance Needed +1  History of Present Illness Patient is 65 y.o. male s/p Rt TKA on 07/04/21 with PMH significant for OA, bladder cancer, COPD, gout, Lt TKA in 2017.  Precautions  Precautions Fall  Restrictions  Weight Bearing Restrictions No  Other Position/Activity Restrictions WBAT  Home Living  Family/patient expects to be discharged to: Private residence  [place];Other relatives  Available Help at Discharge Family;Available PRN/intermittently  Type of Home Mobile home  Home Access Stairs to enter  Entrance Stairs-Number of Steps 2  Entrance Stairs-Rails  None  Home Layout One level  Bathroom Accessibility Yes  [place] - 2 wheels;Crutches  Additional Comments plans to stay with his brother; will stay on main level, with 4 steps into home. Lt hand rail.  Prior Function  Level of Independence Independent;Independent with assistive device(s)  Comments has been using crutches for ~ 3 months due to knee disolcation  Communication  Communication No difficulties  Pain Assessment  Pain Assessment 0-10  Pain Score 8  Pain Location Rt knee  Pain Descriptors / Indicators Aching;Discomfort  Pain Intervention(s) Limited activity within patient's tolerance;Monitored during session;Repositioned;Premedicated before session  Cognition  Arousal/Alertness Awake/alert  Behavior During Therapy WFL for tasks assessed/performed  Overall Cognitive Status Within Functional Limits for tasks assessed  Upper Extremity Assessment  Upper Extremity Assessment Overall WFL for tasks assessed  Lower Extremity Assessment  Lower Extremity Assessment Overall WFL for tasks assessed;RLE deficits/detail  RLE Deficits / Details good quad activation, no extensor lag with SLR  RLE Sensation WNL  RLE Coordination WNL  Bed Mobility  Overal bed mobility Needs Assistance  Bed Mobility Supine to Sit  Supine to sit Min guard;HOB elevated  General bed mobility comments pt taking extra time, no assist needed.  Transfers  Overall transfer level Needs assistance  Equipment used Rolling walker (2 wheeled)  Transfers Sit to/from Stand  Sit to Stand Min guard;From elevated surface  General transfer comment cues for hand placement/technique with RW, no assist from low stretcher height  Ambulation/Gait  Ambulation/Gait assistance Min guard  Gait Distance (Feet) 130 Feet  Assistive device Rolling  walker (2 wheeled)  Gait Pattern/deviations Step-to pattern;Decreased stride length;Decreased weight shift to right;Decreased stance time - right  General Gait Details cues  for step to pattern, no buckling at Rt knee or LOB noted. pt c/o 8/10 pain with mobility. guard for safety.  Gait velocity decr  Stairs Yes  Stairs assistance Min guard  Stair Management One rail Left;Step to pattern;Forwards;With crutches  Number of Stairs 2  General stair comments cues for step to pattern and crutch position "up with good down with bad" crutch with surgical LE. no LOB noted. pt verbalized understanding of safe assist/guarding for family to provide.  Balance  Overall balance assessment Needs assistance  Sitting-balance support Feet supported  Sitting balance-Leahy Scale Good  Standing balance support Bilateral upper extremity supported;During functional activity  Standing balance-Leahy Scale Fair  General Comments  General comments (skin integrity, edema, etc.) pt bradycardicac during gait with HR range low of 29 bpm and mostly maintainedi n 50-80 with high of 110 bpm. Pt desat to 86% on RA with gait.  Exercises  Exercises Total Joint  Total Joint Exercises  Ankle Circles/Pumps AROM;Both;20 reps  PT - End of Session  Equipment Utilized During Treatment Gait belt  Activity Tolerance Patient tolerated treatment well  Patient left in chair;with call bell/phone within reach  Nurse Communication Mobility status  PT Assessment  PT Recommendation/Assessment Patient needs continued PT services  PT Visit Diagnosis Muscle weakness (generalized) (M62.81);Difficulty in walking, not elsewhere classified (R26.2)  PT Problem List Decreased strength;Decreased range of motion;Decreased activity tolerance;Decreased balance;Decreased mobility;Decreased knowledge of use of DME;Decreased knowledge of precautions  PT Plan  PT Frequency (ACUTE ONLY) 7X/week  PT Treatment/Interventions (ACUTE ONLY) DME instruction;Gait training;Stair training;Functional mobility training;Therapeutic activities;Therapeutic exercise;Balance training;Patient/family education  AM-PAC PT "6 Clicks" Mobility  Outcome Measure (Version 2)  Help needed turning from your back to your side while in a flat bed without using bedrails? 4  Help needed moving from lying on your back to sitting on the side of a flat bed without using bedrails? 3  Help needed moving to and from a bed to a chair (including a wheelchair)? 3  Help needed standing up from a chair using your arms (e.g., wheelchair or bedside chair)? 3  Help needed to walk in hospital room? 3  Help needed climbing 3-5 steps with a railing?  3  6 Click Score 19  Consider Recommendation of Discharge To: Home with [facility]  Progressive Mobility  What is the highest level of mobility based on the progressive mobility assessment? Level 5 (Walks with assist in room/hall) - Balance while stepping forward/back and can walk in room with assist - Complete  Mobility Ambulated with assistance in hallway  PT Recommendation  Follow Up Recommendations Follow surgeon's recommendation for DC plan and follow-up therapies;Home health PT  PT equipment 3in1 (PT)  Individuals Consulted  Consulted and Agree with Results and Recommendations Patient  Acute Rehab PT Goals  Patient Stated Goal get back working in [employer]  PT Goal Formulation With patient  Time For Goal Achievement 07/11/21  Potential to Achieve Goals Good  PT Time Calculation  PT Start Time (ACUTE ONLY) 1245  PT Stop Time (ACUTE ONLY) 1313  PT Time Calculation (min) (ACUTE ONLY) 28 min  PT General Charges  $$ ACUTE PT VISIT 1 Visit  PT Evaluation  $PT Eval Low Complexity 1 Low  PT Treatments  $Gait Training 8-22 mins  Written Expression  Dominant Hand Right    Gwynneth Albright PT, DPT Acute Rehabilitation  Services Office [phone] Pager [phone]   Jacques Navy 07/04/2021, 6:15 PM

## 2021-07-04 NOTE — H&P (Signed)
The recent History & Physical has been reviewed. I have personally examined the patient today. There is no interval change to the documented History & Physical. The patient would like to proceed with the procedure.  Garald Balding 07/04/2021,  7:12 AM

## 2021-07-04 NOTE — Anesthesia Procedure Notes (Signed)
Spinal  Patient location during procedure: OR End time: 07/04/2021 7:30 AM Reason for block: surgical anesthesia Staffing Performed: other anesthesia staff  Anesthesiologist: Nolon Nations, MD Resident/CRNA: Noralyn Pick D, CRNA Preanesthetic Checklist Completed: patient identified, IV checked, site marked, risks and benefits discussed, surgical consent, monitors and equipment checked, pre-op evaluation and timeout performed Spinal Block Patient position: sitting Prep: DuraPrep Patient monitoring: heart rate, continuous pulse ox and blood pressure Approach: midline Location: L3-4 Injection technique: single-shot Needle Needle type: Pencan  Needle gauge: 24 G Needle length: 9 cm Assessment Sensory level: T6 Events: CSF return Additional Notes

## 2021-07-04 NOTE — Transfer of Care (Signed)
Immediate Anesthesia Transfer of Care Note  Patient: Edwin Hudson  Procedure(s) Performed: RIGHT TOTAL KNEE ARTHROPLASTY (Right: Knee)  Patient Location: PACU  Anesthesia Type:Regional and Spinal  Level of Consciousness: awake, alert  and oriented  Airway & Oxygen Therapy: Patient Spontanous Breathing and Patient connected to face mask oxygen  Post-op Assessment: Report given to RN and Post -op Vital signs reviewed and stable  Post vital signs: Reviewed and stable  Last Vitals:  Vitals Value Taken Time  BP 109/72 07/04/21 0940  Temp    Pulse 47 07/04/21 0941  Resp 19 07/04/21 0941  SpO2 98 % 07/04/21 0941  Vitals shown include unvalidated device data.  Last Pain:  Vitals:   07/04/21 0550  TempSrc: Oral  PainSc:          Complications: No notable events documented.

## 2021-07-04 NOTE — Progress Notes (Signed)
Orthopedic Tech Progress Note Patient Details:  Edwin Hudson 1956/07/08 [id number]  CPM Right Knee CPM Right Knee: On Right Knee Flexion (Degrees): 65 Right Knee Extension (Degrees): 0  Post Interventions Patient Tolerated: Well  Edwin Hudson 07/04/2021, 8:04 PM

## 2021-07-04 NOTE — Op Note (Signed)
PATIENT ID:      Edwin Hudson  MRN:     [id number] DOB/AGE:    1956-03-09 / 65 y.o.       OPERATIVE REPORT    DATE OF PROCEDURE:  07/04/2021       PREOPERATIVE DIAGNOSIS: end stage  right knee osteoarthritis                                                       Estimated body mass index is 22.15 kg/m as calculated from the following:   Height as of 06/28/21: 5\' 9"  (1.753 m).   Weight as of this encounter: 68 kg.     POSTOPERATIVE DIAGNOSIS: end stage  right knee osteoarthritis                                                                     Estimated body mass index is 22.15 kg/m as calculated from the following:   Height as of 06/28/21: 5\' 9"  (1.753 m).   Weight as of this encounter: 68 kg.     PROCEDURE:  Procedure(s): RIGHT TOTAL KNEE ARTHROPLASTY      SURGEON:  Joni Fears, MD    ASSISTANT:   Biagio Borg, PA-C   (Present and scrubbed throughout the case, critical for assistance with exposure, retraction, instrumentation, and closure.)          ANESTHESIA: regional, spinal, and IV sedation     DRAINS: none :      TOURNIQUET TIME:  Total Tourniquet Time Documented: Thigh (Right) - 66 minutes Total: Thigh (Right) - 66 minutes     COMPLICATIONS:  None   CONDITION:  stable  PROCEDURE IN [id number]   Edwin Hudson 07/04/2021, 9:17 AM

## 2021-07-05 ENCOUNTER — Telehealth: Payer: Self-pay

## 2021-07-05 DIAGNOSIS — M1711 Unilateral primary osteoarthritis, right knee: Secondary | ICD-10-CM | POA: Diagnosis not present

## 2021-07-05 LAB — CBC
HCT: 33.7 % — ABNORMAL LOW (ref 39.0–52.0)
Hemoglobin: 10.9 g/dL — ABNORMAL LOW (ref 13.0–17.0)
MCH: 32.3 pg (ref 26.0–34.0)
MCHC: 32.3 g/dL (ref 30.0–36.0)
MCV: 100 fL (ref 80.0–100.0)
Platelets: 193 10*3/uL (ref 150–400)
RBC: 3.37 MIL/uL — ABNORMAL LOW (ref 4.22–5.81)
RDW: 13.1 % (ref 11.5–15.5)
WBC: 11.6 10*3/uL — ABNORMAL HIGH (ref 4.0–10.5)
nRBC: 0 % (ref 0.0–0.2)

## 2021-07-05 LAB — BASIC METABOLIC PANEL
Anion gap: 6 (ref 5–15)
BUN: 11 mg/dL (ref 8–23)
CO2: 29 mmol/L (ref 22–32)
Calcium: 8.9 mg/dL (ref 8.9–10.3)
Chloride: 104 mmol/L (ref 98–111)
Creatinine, Ser: 0.79 mg/dL (ref 0.61–1.24)
GFR, Estimated: >60 mL/min (ref >60)
Glucose, Bld: 133 mg/dL — ABNORMAL HIGH (ref 70–99)
Potassium: 4.5 mmol/L (ref 3.5–5.1)
Sodium: 139 mmol/L (ref 135–145)

## 2021-07-05 MED ORDER — ASPIRIN 81 MG PO CHEW: 81.0000 mg | CHEWABLE_TABLET | Freq: Two times a day (BID) | ORAL | 0 refills | Status: DC

## 2021-07-05 NOTE — TOC Transition Note (Signed)
Transition of Care [facility]) - CM/SW Discharge Note  Patient Details  Name: Edwin Hudson MRN: [id number] Date of Birth: 1956/11/26  [facility]) CM/SW Contact:  Sherie Don, LCSW Phone Number: 07/05/2021, 12:18 PM  Clinical Narrative: Patient to discharge after working with PT. CSW met with patient to review discharge needs. Patient has been prearranged with [facility] for [place]. Patient has a rolling walker, 3N1, and crutches, so there are no DME needs. [employer] will set up the CPM after patient discharges to his brother's home. TOC signing off.  Final next level of care: [place] Barriers to Discharge: No Barriers Identified  Patient Goals and CMS Choice Patient states their goals for this hospitalization and ongoing recovery are:: Discharge home with [place] CMS Medicare.gov Compare Post Acute Care list provided to:: Patient Choice offered to / list presented to : Patient  Discharge Plan and Services         DME Arranged: N/A DME Agency: NA HH Arranged: PT HH Agency: [place] Representative spoke with at [facility] in orthopedist's office  Readmission Risk Interventions No flowsheet data found.

## 2021-07-05 NOTE — Plan of Care (Signed)
  Problem: Education: Goal: Knowledge of the prescribed therapeutic regimen will improve Outcome: Adequate for Discharge Goal: Individualized Educational Video(s) Outcome: Adequate for Discharge   Problem: Activity: Goal: Ability to avoid complications of mobility impairment will improve Outcome: Adequate for Discharge Goal: Range of joint motion will improve Outcome: Adequate for Discharge   Problem: Clinical Measurements: Goal: Postoperative complications will be avoided or minimized Outcome: Adequate for Discharge   Problem: Pain Management: Goal: Pain level will decrease with appropriate interventions Outcome: Adequate for Discharge   Problem: Skin Integrity: Goal: Will show signs of wound healing Outcome: Adequate for Discharge   Problem: Education: Goal: Knowledge of General Education information will improve Description: Including pain rating scale, medication(s)/side effects and non-pharmacologic comfort measures Outcome: Adequate for Discharge   Problem: Health Behavior/Discharge Planning: Goal: Ability to manage health-related needs will improve Outcome: Adequate for Discharge   Problem: Clinical Measurements: Goal: Ability to maintain clinical measurements within normal limits will improve Outcome: Adequate for Discharge Goal: Will remain free from infection Outcome: Adequate for Discharge Goal: Diagnostic test results will improve Outcome: Adequate for Discharge Goal: Cardiovascular complication will be avoided Outcome: Adequate for Discharge   Problem: Activity: Goal: Risk for activity intolerance will decrease Outcome: Adequate for Discharge   Problem: Nutrition: Goal: Adequate nutrition will be maintained Outcome: Adequate for Discharge   Problem: Coping: Goal: Level of anxiety will decrease Outcome: Adequate for Discharge   Problem: Elimination: Goal: Will not experience complications related to bowel motility Outcome: Adequate for  Discharge Goal: Will not experience complications related to urinary retention Outcome: Adequate for Discharge   Problem: Pain Managment: Goal: General experience of comfort will improve Outcome: Adequate for Discharge   Problem: Safety: Goal: Ability to remain free from injury will improve Outcome: Adequate for Discharge   Problem: Skin Integrity: Goal: Risk for impaired skin integrity will decrease Outcome: Adequate for Discharge

## 2021-07-05 NOTE — Progress Notes (Signed)
Physical Therapy Treatment Patient Details Name: REFUJIO HAYMER MRN: [id number] DOB: 10-08-1956 Today's Date: 07/05/2021    History of Present Illness Patient is 65 y.o. male s/p Rt TKA on 07/04/21 with PMH significant for OA, bladder cancer, COPD, gout, Lt TKA in 2017.    PT Comments    Pt up in chair, assisted pt out of chair to amb. Pt ambulated from room to therapy gym with slight increase in pain during [place], from 7-8/10 and min guard for safety. No LOB or knee buckling noted. Pt required 50% verbal cueing to ascend and descend stairs with correct sequence and safely. Focused TE on LE ROM. Addressed all mobility questions, discussed appropriate activity, educated on use of ICE.  Pt ready for D/C to home.   Follow Up Recommendations  Follow surgeon's recommendation for DC plan and follow-up therapies;Home health PT     Equipment Recommendations  3in1 (PT)    Recommendations for Other Services       Precautions / Restrictions Precautions Precautions: Fall Restrictions Weight Bearing Restrictions: No Other Position/Activity Restrictions: WBAT    Mobility  Bed Mobility                    Transfers Overall transfer level: Needs assistance Equipment used: Rolling walker (2 wheeled) Transfers: Sit to/from Stand Sit to Stand: Min guard         General transfer comment: minimal cues for hand placement  Ambulation/Gait Ambulation/Gait assistance: Min guard Gait Distance (Feet): 200 Feet Assistive device: Rolling walker (2 wheeled) Gait Pattern/deviations: Step-to pattern;Decreased stride length;Step-through pattern;Decreased stance time - right Gait velocity: decr   General Gait Details: cues for RW proximity. no LOB, no knee buckling.   Stairs   Stairs assistance: Min guard Stair Management: One rail Left;Step to pattern;Forwards;With crutches Number of Stairs: 2 General stair comments: 50% cues for step to pattern "up with good down with bad" crutch  with surgical LE. no LOB noted. pt continues to verbalize understanding of safe assist/guarding for family to provide.   Wheelchair Mobility    Modified Rankin (Stroke Patients Only)       Balance Overall balance assessment: Needs assistance Sitting-balance support: Feet supported Sitting balance-Leahy Scale: Good     Standing balance support: Bilateral upper extremity supported;During functional activity Standing balance-Leahy Scale: Fair                              Cognition Arousal/Alertness: Awake/alert Behavior During Therapy: WFL for tasks assessed/performed Overall Cognitive Status: Within Functional Limits for tasks assessed                                        Exercises Total Joint Exercises Ankle Circles/Pumps: AROM;Both;20 reps Quad Sets: AROM;Right;5 reps;Supine Short Arc Quad: AROM;Right;10 reps;Supine Heel Slides: AROM;AAROM;Right;10 reps;Supine Hip ABduction/ADduction: AROM;AAROM;Right;5 reps Straight Leg Raises: AROM;AAROM;Right;5 reps    General Comments        Pertinent Vitals/Pain Pain Assessment: 0-10 Pain Score: 7  Pain Descriptors / Indicators: Aching;Discomfort;Sore Pain Intervention(s): Limited activity within patient's tolerance;Monitored during session;Premedicated before session    Home Living                      Prior Function            PT Goals (current goals can now be found in  the care plan section) Acute Rehab PT Goals Patient Stated Goal: get back working in [employer] PT Goal Formulation: With patient Time For Goal Achievement: 07/11/21 Potential to Achieve Goals: Good Progress towards PT goals: Progressing toward goals    Frequency    7X/week      PT Plan      Co-evaluation              AM-PAC PT "6 Clicks" Mobility   Outcome Measure  Help needed turning from your back to your side while in a flat bed without using bedrails?: None Help needed moving from lying  on your back to sitting on the side of a flat bed without using bedrails?: A Little   Help needed standing up from a chair using your arms (e.g., wheelchair or bedside chair)?: A Little Help needed to walk in hospital room?: A Little Help needed climbing 3-5 steps with a railing? : A Little 6 Click Score: 16    End of Session Equipment Utilized During Treatment: Gait belt Activity Tolerance: Patient tolerated treatment well Patient left: in chair;with call bell/phone within reach Nurse Communication: Mobility status PT Visit Diagnosis: Muscle weakness (generalized) (M62.81);Difficulty in walking, not elsewhere classified (R26.2)     Time: 1561-5379 PT Time Calculation (min) (ACUTE ONLY): 27 min  Charges:  $Gait Training: 8-22 mins $Therapeutic Exercise: 8-22 mins                     Ernst Spell, PTA Student  Acute Rehabilitation Services Pager : [phone] Office : [place] 07/05/2021, 12:43 PM

## 2021-07-05 NOTE — Progress Notes (Signed)
Discussed with patient discharge instructions, they verbalized agreement and understanding.  Patient to leave in private vehicle with all belongings.

## 2021-07-05 NOTE — Discharge Summary (Addendum)
Joni Fears, MD   Biagio Borg, PA-C [address], [place], [place]  [address]                             [id number]  PATIENT ID: Edwin Hudson        MRN:  [id number]          DOB/AGE: 06-09-1956 / 65 y.o.    DISCHARGE SUMMARY  ADMISSION DATE:    07/04/2021 DISCHARGE DATE:   07/05/2021   ADMISSION DIAGNOSIS: Osteoarthritis of right knee [M17.11] Total knee replacement status, right [Z96.651]    DISCHARGE DIAGNOSIS:  right knee osteoarthritis    ADDITIONAL DIAGNOSIS: Active Problems:   Osteoarthritis of right knee   Total knee replacement status, right  Past Medical History:  Diagnosis Date   Arthritis    Cancer ([place])    bladder cancer   COPD (chronic obstructive pulmonary disease) ([place])    Gout     PROCEDURE: Procedure(s): RIGHT TOTAL KNEE ARTHROPLASTY Right on 07/04/2021  CONSULTS: none    HISTORY:  H&P in chart  HOSPITAL COURSE:  Edwin Hudson is a 65 y.o. admitted on 07/04/2021 and found to have a diagnosis of right knee osteoarthritis.  After appropriate laboratory studies were obtained  they were taken to the operating room on 07/04/2021 and underwent  Procedure(s): RIGHT TOTAL KNEE ARTHROPLASTY  Right.   They were given perioperative antibiotics:  Anti-infectives (From admission, onward)    Start     Dose/Rate Route Frequency Ordered Stop   07/04/21 1600  ceFAZolin (ANCEF) IVPB 1 g/50 mL premix        1 g 100 mL/hr over 30 Minutes Intravenous Every 6 hours 07/04/21 1533 07/04/21 2305   07/04/21 0600  ceFAZolin (ANCEF) 2 g in sodium chloride 0.9 % 100 mL IVPB        2 g 200 mL/hr over 30 Minutes Intravenous On call 07/04/21 0512 07/04/21 0732     .  Tolerated the procedure well.  Had difficulty with pain management and medications were adjusted.   Allowed out of bed to a chair.  PT for ambulation and exercise program.    IV saline locked.  O2 discontionued.  POD #1 continued PT and ambulation.  Pain was controlled.   The remainder of the  hospital course was dedicated to ambulation and strengthening.   The patient was discharged on 1 Day Post-Op in  Stable condition.  Blood products given:none  DIAGNOSTIC STUDIES: Recent vital signs: Patient Vitals for the past 24 hrs:  BP Temp Temp src Pulse Resp SpO2 Height Weight  07/05/21 0514 (!) 149/89 97.8 F (36.6 C) Oral (!) 55 18 97 % -- --  07/05/21 0036 106/69 98 F (36.7 C) Oral 61 18 92 % -- --  07/04/21 2051 120/61 97.7 F (36.5 C) Oral (!) 55 18 92 % -- --  07/04/21 1822 115/72 (!) 97.5 F (36.4 C) Oral (!) 48 16 95 % 5\' 9"  (1.753 m) 68 kg  07/04/21 1645 115/64 -- -- (!) 52 -- 91 % -- --  07/04/21 1445 113/66 -- -- (!) 46 16 93 % -- --  07/04/21 1345 123/66 -- -- (!) 47 16 96 % -- --  07/04/21 1245 114/74 -- -- (!) 48 16 94 % -- --  07/04/21 1145 111/63 -- -- (!) 54 16 91 % -- --  07/04/21 1045 110/71 (!) 97 F (36.1 C) -- 66 14 92 % -- --  07/04/21 1030 112/70 -- -- (!) 54 14 97 % -- --  07/04/21 1015 108/65 -- -- (!) 56 11 92 % -- --  07/04/21 1000 115/67 -- -- (!) 51 15 95 % -- --  07/04/21 0945 (!) 114/58 -- -- (!) 53 19 100 % -- --  07/04/21 0940 109/72 97.6 F (36.4 C) -- (!) 45 16 97 % -- --       Recent laboratory studies: Recent Labs    06/28/21 1341 07/04/21 0948 07/05/21 0306  WBC 10.7* 8.2 11.6*  HGB 14.1 12.2* 10.9*  HCT 42.7 37.2* 33.7*  PLT 240 174 193   Recent Labs    06/28/21 1341 07/04/21 0948 07/05/21 0306  NA 139 137 139  K 4.3 4.1 4.5  CL 103 102 104  CO2 28 26 29   BUN 9 10 11   CREATININE 0.82 0.81 0.79  GLUCOSE 100* 152* 133*  CALCIUM 9.7 8.9 8.9   Lab Results  Component Value Date   INR 1.01 09/18/2016     Recent Radiographic Studies :  No results found.  DISCHARGE INSTRUCTIONS: Discharge Instructions     CPM   Complete by: As directed    Continuous passive motion machine (CPM):      Use the CPM from 0 to 60 for 6-8 hours per day.      You may increase by 5-10 degrees per day.  You may break it up into 2 or 3  sessions per day.      Use CPM for 3-4  weeks or until you are told to stop.   CPM   Complete by: As directed    Continuous passive motion machine (CPM):      Use the CPM from 0 to 60 for 6-8 hours per day.      You may increase by 5-10 degrees per day.  You may break it up into 2 or 3 sessions per day.      Use CPM for 3-4  weeks or until you are told to stop.   Call MD / Call 911   Complete by: As directed    If you experience chest pain or shortness of breath, CALL 911 and be transported to the hospital emergency room.  If you develope a fever above 101 F, pus (white drainage) or increased drainage or redness at the wound, or calf pain, call your surgeon's office.   Call MD / Call 911   Complete by: As directed    If you experience chest pain or shortness of breath, CALL 911 and be transported to the hospital emergency room.  If you develope a fever above 101 F, pus (white drainage) or increased drainage or redness at the wound, or calf pain, call your surgeon's office.   Change dressing   Complete by: As directed    DO NOT CHANGE YOUR DRESSING   Change dressing   Complete by: As directed    DO NOT CHANGE YOUR DRESSING   Constipation Prevention   Complete by: As directed    Drink plenty of fluids.  Prune juice may be helpful.  You may use a stool softener, such as Colace (over the counter) 100 mg twice a day.  Use MiraLax (over the counter) for constipation as needed.   Constipation Prevention   Complete by: As directed    Drink plenty of fluids.  Prune juice may be helpful.  You may use a stool softener, such as Colace (over the counter) 100 mg twice  a day.  Use MiraLax (over the counter) for constipation as needed.   Diet general   Complete by: As directed    Discharge instructions   Complete by: As directed    [place] items at home which could result in a fall. This includes throw rugs or furniture in walking pathways ICE to the affected  joint every three hours while awake for 30 minutes at a time, for at least the first 3-5 days, and then as needed for pain and swelling.  Continue to use ice for pain and swelling. You may notice swelling that will progress down to the foot and ankle.  This is normal after surgery.  Elevate your leg when you are not up walking on it.   Continue to use the breathing machine you got in the hospital (incentive spirometer) which will help keep your temperature down.  It is common for your temperature to cycle up and down following surgery, especially at night when you are not up moving around and exerting yourself.  The breathing machine keeps your lungs expanded and your temperature down.   DIET:  As you were doing prior to hospitalization, we recommend a well-balanced diet.  DRESSING / WOUND CARE / SHOWERING  Keep the surgical dressing until follow up.  The dressing is water proof, so you can shower without any extra covering.  IF THE DRESSING FALLS OFF or the wound gets wet inside, change the dressing with sterile gauze.  Please use good hand washing techniques before changing the dressing.  Do not use any lotions or creams on the incision until instructed by your surgeon.    ACTIVITY  Increase activity slowly as tolerated, but follow the weight bearing instructions below.   No driving for 6 weeks or until further direction given by your physician.  You cannot drive while taking narcotics.  No lifting or carrying greater than 10 lbs. until further directed by your surgeon. Avoid periods of inactivity such as sitting longer than an hour when not asleep. This helps prevent blood clots.  You may return to work once you are authorized by your doctor.     WEIGHT BEARING   Weight bearing as tolerated with assist device (walker, cane, etc) as directed, use it as long as suggested by your surgeon or therapist, typically at least 4-6 weeks.   EXERCISES  Results after joint replacement surgery are  often greatly improved when you follow the exercise, range of motion and muscle strengthening exercises prescribed by your doctor. Safety measures are also important to protect the joint from further injury. Any time any of these exercises cause you to have increased pain or swelling, decrease what you are doing until you are comfortable again and then slowly increase them. If you have problems or questions, call your caregiver or physical therapist for advice.   Rehabilitation is important following a joint replacement. After just a few days of immobilization, the muscles of the leg can become weakened and shrink (atrophy).  These exercises are designed to build up the tone and strength of the thigh and leg muscles and to improve motion. Often times heat used for twenty to thirty minutes before working out will loosen up your tissues and help with improving the range of motion but do not use heat for the first two weeks following surgery (sometimes heat can increase post-operative swelling).   These exercises can be done on a training (exercise) mat, on the floor, on a  table or on a bed. Use whatever works the best and is most comfortable for you.    Use music or television while you are exercising so that the exercises are a pleasant break in your day. This will make your life better with the exercises acting as a break in your routine that you can look forward to.   Perform all exercises about fifteen times, three times per day or as directed.  You should exercise both the operative leg and the other leg as well.  Exercises include:   Quad Sets - Tighten up the muscle on the front of the thigh (Quad) and hold for 5-10 seconds.   Straight Leg Raises - With your knee straight (if you were given a brace, keep it on), lift the leg to 60 degrees, hold for 3 seconds, and slowly lower the leg.  Perform this exercise against resistance later as your leg gets stronger.  Leg Slides: Lying on your back, slowly  slide your foot toward your buttocks, bending your knee up off the floor (only go as far as is comfortable). Then slowly slide your foot back down until your leg is flat on the floor again.  Angel Wings: Lying on your back spread your legs to the side as far apart as you can without causing discomfort.  Hamstring Strength:  Lying on your back, push your heel against the floor with your leg straight by tightening up the muscles of your buttocks.  Repeat, but this time bend your knee to a comfortable angle, and push your heel against the floor.  You may put a pillow under the heel to make it more comfortable if necessary.   A rehabilitation program following joint replacement surgery can speed recovery and prevent re-injury in the future due to weakened muscles. Contact your doctor or a physical therapist for more information on knee rehabilitation.    CONSTIPATION  Constipation is defined medically as fewer than three stools per week and severe constipation as less than one stool per week.  Even if you have a regular bowel pattern at home, your normal regimen is likely to be disrupted due to multiple reasons following surgery.  Combination of anesthesia, postoperative narcotics, change in appetite and fluid intake all can affect your bowels.   YOU MUST use at least one of the following options; they are listed in order of increasing strength to get the job done.  They are all available over the counter, and you may need to use some, POSSIBLY even all of these options:    Drink plenty of fluids (prune juice may be helpful) and high fiber foods Colace 100 mg by mouth twice a day  Senokot for constipation as directed and as needed Dulcolax (bisacodyl), take with full glass of water  Miralax (polyethylene glycol) once or twice a day as needed.  If you have tried all these things and are unable to have a bowel movement in the first 3-4 days after surgery call either your surgeon or your primary doctor.     If you experience loose stools or diarrhea, hold the medications until you stool forms back up.  If your symptoms do not get better within 1 week or if they get worse, check with your doctor.  If you experience "the worst abdominal pain ever" or develop nausea or vomiting, please contact the office immediately for further recommendations for treatment.   ITCHING:  If you experience itching with your medications, try taking only a single pain  pill, or even half a pain pill at a time.  You can also use Benadryl over the counter for itching or also to help with sleep.   TED HOSE STOCKINGS:  Use stockings on both legs until for at least 2 weeks or as directed by physician office. They may be removed at night for sleeping.  MEDICATIONS:  See your medication summary on the "After Visit Summary" that nursing will review with you.  You may have some home medications which will be placed on hold until you complete the course of blood thinner medication.  It is important for you to complete the blood thinner medication as prescribed.  PRECAUTIONS:  If you experience chest pain or shortness of breath - call 911 immediately for transfer to the hospital emergency department.   If you develop a fever greater that 101 F, purulent drainage from wound, increased redness or drainage from wound, foul odor from the wound/dressing, or calf pain - CONTACT YOUR SURGEON.                                                   FOLLOW-UP APPOINTMENTS:  If you do not already have a post-op appointment, please call the office for an appointment to be seen by your surgeon.  Guidelines for how soon to be seen are listed in your "After Visit Summary", but are typically between 1-4 weeks after surgery.  OTHER INSTRUCTIONS:   Knee Replacement:  Do not place pillow under knee, focus on keeping the knee straight while resting. CPM instructions: 0-90 degrees, 2 hours in the morning, 2 hours in the afternoon, and 2 hours in the evening.  Place foam block, curve side up under heel at all times except when in CPM or when walking.  DO NOT modify, tear, cut, or change the foam block in any way.  POST-OPERATIVE OPIOID TAPER INSTRUCTIONS: It is important to wean off of your opioid medication as soon as possible. If you do not need pain medication after your surgery it is ok to stop day one. Opioids include: Codeine, Hydrocodone(Norco, Vicodin), Oxycodone(Percocet, oxycontin) and hydromorphone amongst others.  Long term and even short term use of opiods can cause: Increased pain response Dependence Constipation Depression Respiratory depression And more.  Withdrawal symptoms can include Flu like symptoms Nausea, vomiting And more Techniques to manage these symptoms Hydrate well Eat regular healthy meals Stay active Use relaxation techniques(deep breathing, meditating, yoga) Do Not substitute Alcohol to help with tapering If you have been on opioids for less than two weeks and do not have pain than it is ok to stop all together.  Plan to wean off of opioids This plan should start within one week post op of your joint replacement. Maintain the same interval or time between taking each dose and first decrease the dose.  Cut the total daily intake of opioids by one tablet each day Next start to increase the time between doses. The last dose that should be eliminated is the evening dose.     MAKE SURE YOU:  Understand these instructions.  Get help right away if you are not doing well or get worse.    Thank you for letting us be a part of your medical care team.  It is a privilege we respect greatly.  We hope these instructions will help you stay on track  for a fast and full recovery!   Do not put a pillow under the knee. Place it under the heel.   Complete by: As directed    Do not put a pillow under the knee. Place it under the heel.   Complete by: As directed    Driving restrictions   Complete by: As directed    No  driving for 6 weeks   Driving restrictions   Complete by: As directed    No driving for 6 weeks   Increase activity slowly as tolerated   Complete by: As directed    Increase activity slowly as tolerated   Complete by: As directed    Lifting restrictions   Complete by: As directed    No lifting for 6 weeks   Lifting restrictions   Complete by: As directed    No lifting for 6 weeks   Patient may shower   Complete by: As directed    You may shower over the brown dressing   Patient may shower   Complete by: As directed    You may shower over the brown dressing   Post-operative opioid taper instructions:   Complete by: As directed    POST-OPERATIVE OPIOID TAPER INSTRUCTIONS: It is important to wean off of your opioid medication as soon as possible. If you do not need pain medication after your surgery it is ok to stop day one. Opioids include: Codeine, Hydrocodone(Norco, Vicodin), Oxycodone(Percocet, oxycontin) and hydromorphone amongst others.  Long term and even short term use of opiods can cause: Increased pain response Dependence Constipation Depression Respiratory depression And more.  Withdrawal symptoms can include Flu like symptoms Nausea, vomiting And more Techniques to manage these symptoms Hydrate well Eat regular healthy meals Stay active Use relaxation techniques(deep breathing, meditating, yoga) Do Not substitute Alcohol to help with tapering If you have been on opioids for less than two weeks and do not have pain than it is ok to stop all together.  Plan to wean off of opioids This plan should start within one week post op of your joint replacement. Maintain the same interval or time between taking each dose and first decrease the dose.  Cut the total daily intake of opioids by one tablet each day Next start to increase the time between doses. The last dose that should be eliminated is the evening dose.      Post-operative opioid taper instructions:    Complete by: As directed    POST-OPERATIVE OPIOID TAPER INSTRUCTIONS: It is important to wean off of your opioid medication as soon as possible. If you do not need pain medication after your surgery it is ok to stop day one. Opioids include: Codeine, Hydrocodone(Norco, Vicodin), Oxycodone(Percocet, oxycontin) and hydromorphone amongst others.  Long term and even short term use of opiods can cause: Increased pain response Dependence Constipation Depression Respiratory depression And more.  Withdrawal symptoms can include Flu like symptoms Nausea, vomiting And more Techniques to manage these symptoms Hydrate well Eat regular healthy meals Stay active Use relaxation techniques(deep breathing, meditating, yoga) Do Not substitute Alcohol to help with tapering If you have been on opioids for less than two weeks and do not have pain than it is ok to stop all together.  Plan to wean off of opioids This plan should start within one week post op of your joint replacement. Maintain the same interval or time between taking each dose and first decrease the dose.  Cut the total daily intake of opioids by  one tablet each day Next start to increase the time between doses. The last dose that should be eliminated is the evening dose.      TED hose   Complete by: As directed    Use stockings (TED hose) for 2-3 weeks on right leg.  You may remove them at night for sleeping.   TED hose   Complete by: As directed    Use stockings (TED hose) for 2-3 weeks on right leg.  You may remove them at night for sleeping.   Weight bearing as tolerated   Complete by: As directed        DISCHARGE MEDICATIONS:   Allergies as of 07/05/2021   No Known Allergies      Medication List     STOP taking these medications    folic acid 1 MG tablet Commonly known as: FOLVITE   HYDROcodone-acetaminophen 5-325 MG tablet Commonly known as: Norco   thiamine 100 MG tablet   traMADol 50 MG tablet Commonly  known as: ULTRAM       TAKE these medications    acetaminophen 500 MG tablet Commonly known as: TYLENOL Take 1,000 mg by mouth every 6 (six) hours as needed for moderate pain.   allopurinol 100 MG tablet Commonly known as: ZYLOPRIM Take 200 mg by mouth daily.   aspirin EC 81 MG tablet Take 1 tablet (81 mg total) by mouth 2 (two) times daily. Swallow whole.   aspirin 81 MG chewable tablet Chew 1 tablet (81 mg total) by mouth 2 (two) times daily.   Centrum Silver 50+Men Tabs Take 1 tablet by mouth daily.   methocarbamol 500 MG tablet Commonly known as: Robaxin Take 1 tablet (500 mg total) by mouth every 8 (eight) hours as needed for muscle spasms.   oxyCODONE 5 MG immediate release tablet Commonly known as: Roxicodone Take 1 tablet (5 mg total) by mouth every 8 (eight) hours as needed.       ASK your doctor about these medications    povidone-iodine 10 % swab Apply 2 application topically once for 1 dose. Ask about: Should I take this medication?               Durable Medical Equipment  (From admission, onward)           Start     Ordered   07/04/21 1724  DME 3 n 1  Once        07/04/21 1724   07/04/21 1724  DME Bedside commode  Once       Question:  Patient needs a bedside commode to treat with the following condition  Answer:  S/P TKR (total knee replacement) using cement, right   07/04/21 1724   07/04/21 1724  DME Walker rolling  Once       Question:  Patient needs a walker to treat with the following condition  Answer:  S/P TKR (total knee replacement) using cement, right   07/04/21 1724              Discharge Care Instructions  (From admission, onward)           Start     Ordered   07/04/21 0000  Change dressing       Comments: DO NOT CHANGE YOUR DRESSING   07/04/21 0948   07/04/21 0000  Weight bearing as tolerated        07/04/21 0948   07/04/21 0000  Change dressing       Comments:  DO NOT CHANGE YOUR DRESSING   07/04/21 1000             FOLLOW UP VISIT:  2 weeks  DISPOSITION:   Home  CONDITION:  Stable  INSTRUCTIONS AFTER JOINT REPLACEMENT   Remove items at home which could result in a fall. This includes throw rugs or furniture in walking pathways ICE to the affected joint every three hours while awake for 30 minutes at a time, for at least the first 3-5 days, and then as needed for pain and swelling.  Continue to use ice for pain and swelling. You may notice swelling that will progress down to the foot and ankle.  This is normal after surgery.  Elevate your leg when you are not up walking on it.   Continue to use the breathing machine you got in the hospital (incentive spirometer) which will help keep your temperature down.  It is common for your temperature to cycle up and down following surgery, especially at night when you are not up moving around and exerting yourself.  The breathing machine keeps your lungs expanded and your temperature down.   DIET:  As you were doing prior to hospitalization, we recommend a well-balanced diet.  DRESSING / WOUND CARE / SHOWERING  Keep the surgical dressing until follow up.  The dressing is water proof, so you can shower without any extra covering.  IF THE DRESSING FALLS OFF or the wound gets wet inside, change the dressing with sterile gauze.  Please use good hand washing techniques before changing the dressing.  Do not use any lotions or creams on the incision until instructed by your surgeon.    ACTIVITY  Increase activity slowly as tolerated, but follow the weight bearing instructions below.   No driving for 6 weeks or until further direction given by your physician.  You cannot drive while taking narcotics.  No lifting or carrying greater than 10 lbs. until further directed by your surgeon. Avoid periods of inactivity such as sitting longer than an hour when not asleep. This helps prevent blood clots.  You may return to work once you are authorized by your  doctor.     WEIGHT BEARING   Weight bearing as tolerated with assist device (walker, cane, etc) as directed, use it as long as suggested by your surgeon or therapist, typically at least 4-6 weeks.   EXERCISES  Results after joint replacement surgery are often greatly improved when you follow the exercise, range of motion and muscle strengthening exercises prescribed by your doctor. Safety measures are also important to protect the joint from further injury. Any time any of these exercises cause you to have increased pain or swelling, decrease what you are doing until you are comfortable again and then slowly increase them. If you have problems or questions, call your caregiver or physical therapist for advice.   Rehabilitation is important following a joint replacement. After just a few days of immobilization, the muscles of the leg can become weakened and shrink (atrophy).  These exercises are designed to build up the tone and strength of the thigh and leg muscles and to improve motion. Often times heat used for twenty to thirty minutes before working out will loosen up your tissues and help with improving the range of motion but do not use heat for the first two weeks following surgery (sometimes heat can increase post-operative swelling).   These exercises can be done on a training (exercise) mat, on the floor, on a  table or on a bed. Use whatever works the best and is most comfortable for you.    Use music or television while you are exercising so that the exercises are a pleasant break in your day. This will make your life better with the exercises acting as a break in your routine that you can look forward to.   Perform all exercises about fifteen times, three times per day or as directed.  You should exercise both the operative leg and the other leg as well.  Exercises include:   Quad Sets - Tighten up the muscle on the front of the thigh (Quad) and hold for 5-10 seconds.   Straight Leg  Raises - With your knee straight (if you were given a brace, keep it on), lift the leg to 60 degrees, hold for 3 seconds, and slowly lower the leg.  Perform this exercise against resistance later as your leg gets stronger.  Leg Slides: Lying on your back, slowly slide your foot toward your buttocks, bending your knee up off the floor (only go as far as is comfortable). Then slowly slide your foot back down until your leg is flat on the floor again.  Angel Wings: Lying on your back spread your legs to the side as far apart as you can without causing discomfort.  Hamstring Strength:  Lying on your back, push your heel against the floor with your leg straight by tightening up the muscles of your buttocks.  Repeat, but this time bend your knee to a comfortable angle, and push your heel against the floor.  You may put a pillow under the heel to make it more comfortable if necessary.   A rehabilitation program following joint replacement surgery can speed recovery and prevent re-injury in the future due to weakened muscles. Contact your doctor or a physical therapist for more information on knee rehabilitation.    CONSTIPATION  Constipation is defined medically as fewer than three stools per week and severe constipation as less than one stool per week.  Even if you have a regular bowel pattern at home, your normal regimen is likely to be disrupted due to multiple reasons following surgery.  Combination of anesthesia, postoperative narcotics, change in appetite and fluid intake all can affect your bowels.   YOU MUST use at least one of the following options; they are listed in order of increasing strength to get the job done.  They are all available over the counter, and you may need to use some, POSSIBLY even all of these options:    Drink plenty of fluids (prune juice may be helpful) and high fiber foods Colace 100 mg by mouth twice a day  Senokot for constipation as directed and as needed Dulcolax  (bisacodyl), take with full glass of water  Miralax (polyethylene glycol) once or twice a day as needed.  If you have tried all these things and are unable to have a bowel movement in the first 3-4 days after surgery call either your surgeon or your primary doctor.    If you experience loose stools or diarrhea, hold the medications until you stool forms back up.  If your symptoms do not get better within 1 week or if they get worse, check with your doctor.  If you experience "the worst abdominal pain ever" or develop nausea or vomiting, please contact the office immediately for further recommendations for treatment.   ITCHING:  If you experience itching with your medications, try taking only a single pain  pill, or even half a pain pill at a time.  You can also use Benadryl over the counter for itching or also to help with sleep.   TED HOSE STOCKINGS:  Use stockings on both legs until for at least 2 weeks or as directed by physician office. They may be removed at night for sleeping.  MEDICATIONS:  See your medication summary on the "After Visit Summary" that nursing will review with you.  You may have some home medications which will be placed on hold until you complete the course of blood thinner medication.  It is important for you to complete the blood thinner medication as prescribed.  PRECAUTIONS:  If you experience chest pain or shortness of breath - call 911 immediately for transfer to the hospital emergency department.   If you develop a fever greater that 101 F, purulent drainage from wound, increased redness or drainage from wound, foul odor from the wound/dressing, or calf pain - CONTACT YOUR SURGEON.                                                   FOLLOW-UP APPOINTMENTS:  If you do not already have a post-op appointment, please call the office for an appointment to be seen by your surgeon.  Guidelines for how soon to be seen are listed in your "After Visit Summary", but are typically  between 1-4 weeks after surgery.  OTHER INSTRUCTIONS:   Knee Replacement:  Do not place pillow under knee, focus on keeping the knee straight while resting. CPM instructions: 0-90 degrees, 2 hours in the morning, 2 hours in the afternoon, and 2 hours in the evening. Place foam block, curve side up under heel at all times except when in CPM or when walking.  DO NOT modify, tear, cut, or change the foam block in any way.  POST-OPERATIVE OPIOID TAPER INSTRUCTIONS: It is important to wean off of your opioid medication as soon as possible. If you do not need pain medication after your surgery it is ok to stop day one. Opioids include: Codeine, Hydrocodone(Norco, Vicodin), Oxycodone(Percocet, oxycontin) and hydromorphone amongst others.  Long term and even short term use of opiods can cause: Increased pain response Dependence Constipation Depression Respiratory depression And more.  Withdrawal symptoms can include Flu like symptoms Nausea, vomiting And more Techniques to manage these symptoms Hydrate well Eat regular healthy meals Stay active Use relaxation techniques(deep breathing, meditating, yoga) Do Not substitute Alcohol to help with tapering If you have been on opioids for less than two weeks and do not have pain than it is ok to stop all together.  Plan to wean off of opioids This plan should start within one week post op of your joint replacement. Maintain the same interval or time between taking each dose and first decrease the dose.  Cut the total daily intake of opioids by one tablet each day Next start to increase the time between doses. The last dose that should be eliminated is the evening dose.   MAKE SURE YOU:  Understand these instructions.  Get help right away if you are not doing well or get worse.    Thank you for letting us be a part of your medical care team.  It is a privilege we respect greatly.  We hope these instructions will help you stay on track for a  fast and full recovery!        Mike Craze [place], [place] [phone]  07/05/2021 8:53 AM

## 2021-07-05 NOTE — Op Note (Signed)
PATIENT ID: Edwin Hudson        MRN:  [id number]          DOB/AGE: 1956/02/07 / 65 y.o.    Joni Fears, MD   Biagio Borg, PA-C [address] [place],   [address]                             [phone]   PROGRESS NOTE  Subjective:  negative for Chest Pain  negative for Shortness of Breath  negative for Nausea/Vomiting   negative for Calf Pain    Tolerating Diet: yes         Patient reports pain as moderate.     Controlled with oxycodone and robaxin  Objective: Vital signs in last 24 hours:   Patient Vitals for the past 24 hrs:  BP Temp Temp src Pulse Resp SpO2 Height Weight  07/05/21 0514 (!) 149/89 97.8 F (36.6 C) Oral (!) 55 18 97 % -- --  07/05/21 0036 106/69 98 F (36.7 C) Oral 61 18 92 % -- --  07/04/21 2051 120/61 97.7 F (36.5 C) Oral (!) 55 18 92 % -- --  07/04/21 1822 115/72 (!) 97.5 F (36.4 C) Oral (!) 48 16 95 % 5\' 9"  (1.753 m) 68 kg  07/04/21 1645 115/64 -- -- (!) 52 -- 91 % -- --  07/04/21 1445 113/66 -- -- (!) 46 16 93 % -- --  07/04/21 1345 123/66 -- -- (!) 47 16 96 % -- --  07/04/21 1245 114/74 -- -- (!) 48 16 94 % -- --  07/04/21 1145 111/63 -- -- (!) 54 16 91 % -- --  07/04/21 1045 110/71 (!) 97 F (36.1 C) -- 66 14 92 % -- --  07/04/21 1030 112/70 -- -- (!) 54 14 97 % -- --  07/04/21 1015 108/65 -- -- (!) 56 11 92 % -- --  07/04/21 1000 115/67 -- -- (!) 51 15 95 % -- --  07/04/21 0945 (!) 114/58 -- -- (!) 53 19 100 % -- --  07/04/21 0940 109/72 97.6 F (36.4 C) -- (!) 45 16 97 % -- --      Intake/Output from previous day:   07/12 0701 - 07/13 0700 In: 2550 [I.V.:2200] Out: 4725 [Urine:4675]   Intake/Output this shift:   No intake/output data recorded.   Intake/Output      07/12 0701 07/13 0700 07/13 0701 07/14 0700   I.V. (mL/kg) 2200 (32.4)    IV Piggyback 350    Total Intake(mL/kg) 2550 (37.5)    Urine (mL/kg/hr) 4675 (2.9)    Blood 50    Total Output 4725    Net -2175            LABORATORY DATA: Recent  Labs    06/28/21 1341 07/04/21 0948 07/05/21 0306  WBC 10.7* 8.2 11.6*  HGB 14.1 12.2* 10.9*  HCT 42.7 37.2* 33.7*  PLT 240 174 193   Recent Labs    06/28/21 1341 07/04/21 0948 07/05/21 0306  NA 139 137 139  K 4.3 4.1 4.5  CL 103 102 104  CO2 28 26 29   BUN 9 10 11   CREATININE 0.82 0.81 0.79  GLUCOSE 100* 152* 133*  CALCIUM 9.7 8.9 8.9   Lab Results  Component Value Date   INR 1.01 09/18/2016    Recent Radiographic Studies :  No results found.   Examination:  General appearance: alert, cooperative, and  no distress  Wound Exam: clean, dry, intact   Drainage:  None: wound tissue dry  Motor Exam: EHL, FHL, Anterior Tibial, and Posterior Tibial Intact  Sensory Exam: Superficial Peroneal, Deep Peroneal, and Tibial normal  Vascular Exam: Normal  Assessment:    1 Day Post-Op  Procedure(s) (LRB): RIGHT TOTAL KNEE ARTHROPLASTY (Right)  ADDITIONAL DIAGNOSIS:  Active Problems:   Osteoarthritis of right knee   Total knee replacement status, right  Acute Blood Loss Anemia-asymptomatic   Plan: Physical Therapy as ordered Weight Bearing as Tolerated (WBAT)  DVT Prophylaxis:  Aspirin and TED hose  DISCHARGE PLAN: Home  DISCHARGE NEEDS: HHPT, CPM, Walker, and 3-in-1 comode seat   Patient's anticipated LOS is less than 2 midnights, meeting these requirements: - Younger than 57 - Lives within 1 hour of care - Has a competent adult at home to recover with post-op recover - NO history of  - Chronic pain requiring opiods  - Diabetes  - Coronary Artery Disease  - Heart failure  - Heart attack  - Stroke  - DVT/VTE  - Cardiac arrhythmia  - Respiratory Failure/COPD  - Renal failure  - Anemia  - Advanced Liver disease Comfortable this am-some residual adductor canal block with decreased sensation around proximal tibia-OK distally. Dressing dry. Denies SOB or chest pain. No calf pain. Will discharge this am with oxycodone, robaxin and ASA 81 mg po bid. Office  2 weeks        Biagio Borg, [place]  07/05/2021 7:11 AM

## 2021-07-05 NOTE — Telephone Encounter (Signed)
Dr.Whtifield called patient. Directed to take Robaxin three times daily, along with oxycodone every 3-4 hours. If 1 tablet of oxycodone isn't working he can take 2 tablets every 3-4hours. Aspirin twice daily. Also instructed to keep an ice pack on his knee at all times. Patient has also been instructed to not smoke. If still having pain issues tomorrow he has been told to call us tomorrow and he would then call in Dilaudid.  He has been made aware that the block has worn off and pain is to be expected.

## 2021-07-05 NOTE — Telephone Encounter (Signed)
Patient called stating that pain medicine is not helping. Patient had right TKA on 07/04/2021.  Would like a call back to discuss.  Cb# [phone].  Please advise.  Thank you

## 2021-07-06 ENCOUNTER — Other Ambulatory Visit: Payer: Self-pay | Admitting: Orthopaedic Surgery

## 2021-07-06 ENCOUNTER — Encounter (HOSPITAL_COMMUNITY): Payer: Self-pay | Admitting: Orthopaedic Surgery

## 2021-07-06 MED ORDER — HYDROMORPHONE HCL 2 MG PO TABS: 2.0000 mg | ORAL_TABLET | ORAL | 0 refills | Status: AC | PRN

## 2021-07-06 NOTE — Telephone Encounter (Addendum)
Pt stated that the oxycodone still isn't working and would like a call back from Dr. Durward Fortes

## 2021-07-06 NOTE — Telephone Encounter (Signed)
Sent in dilaudid and called brother with whom he is staying

## 2021-07-18 ENCOUNTER — Other Ambulatory Visit: Payer: Self-pay

## 2021-07-18 ENCOUNTER — Ambulatory Visit: Payer: Self-pay

## 2021-07-18 ENCOUNTER — Ambulatory Visit (INDEPENDENT_AMBULATORY_CARE_PROVIDER_SITE_OTHER): Payer: Medicare Other | Admitting: Orthopaedic Surgery

## 2021-07-18 ENCOUNTER — Encounter: Payer: Self-pay | Admitting: Orthopaedic Surgery

## 2021-07-18 VITALS — Ht 69.0 in | Wt 149.0 lb

## 2021-07-18 DIAGNOSIS — Z96651 Presence of right artificial knee joint: Secondary | ICD-10-CM | POA: Diagnosis not present

## 2021-07-18 DIAGNOSIS — M1711 Unilateral primary osteoarthritis, right knee: Secondary | ICD-10-CM | POA: Diagnosis not present

## 2021-07-18 MED ORDER — LIDOCAINE HCL 1 % IJ SOLN
2.0000 mL | INTRAMUSCULAR | Status: AC | PRN
  Administered 2021-07-18: 2 mL

## 2021-07-18 NOTE — Progress Notes (Signed)
Office Visit Note   Patient: Edwin Hudson           Date of Birth: 1956-12-19           MRN: [id number] Visit Date: 07/18/2021              Requested by: Reita May, NP [address] [place],  [place] [address] PCP: Reita May, NP   Assessment & Plan: Visit Diagnoses:  1. S/P total knee arthroplasty, right   2. Unilateral primary osteoarthritis, right knee   3. Total knee replacement status, right     Plan: Films reveal excellent position of the components.  No evidence of any complication.  Patient does smoke but no evidence of DVT.  Continues with 1 baby aspirin twice a day.  Aspirated 70 cc of bloody fluid patient felt much better.  Has full passive extension with an extensor lag actively able about 15 degrees and flexed over 90 degrees.  No instability.  No calf pain.  Neurologically intact.  Staples were removed and Steri-Strips applied.  We will work on home therapy until he returns in 2 weeks.  Not taking any pain pills  Follow-Up Instructions: Return in about 2 weeks (around 08/01/2021).   Orders:  Orders Placed This Encounter  Procedures   Large Joint Inj: R knee   XR KNEE 3 VIEW RIGHT   No orders of the defined types were placed in this encounter.     Procedures: Large Joint Inj: R knee on 07/18/2021 2:33 PM Indications: pain and diagnostic evaluation Details: 25 G 1.5 in needle, anteromedial approach  Arthrogram: No  Medications: 2 mL lidocaine 1 % Aspirate: 70 mL bloody Outcome: tolerated well, no immediate complications Procedure, treatment alternatives, risks and benefits explained, specific risks discussed. Consent was given by the patient. Immediately prior to procedure a time out was called to verify the correct patient, procedure, equipment, support staff and site/side marked as required. Patient was prepped and draped in the usual sterile fashion.      Clinical Data: No additional findings.   Subjective: Chief Complaint  Patient  presents with   Right Knee - Follow-up    Right total knee arthroplasty 07/04/2021  Patient presents today for follow up on his right knee. He had a right total knee arthroplasty on 07/04/2021. He is now two weeks out from surgery. Patient states that he is doing well. He is taking over the counter medicine for pain. He has been doing home physical therapy. Walking with the assistance of a crutch.  HPI  Review of Systems   Objective: Vital Signs: Ht '5\' 9"'$  (1.753 m)   Wt 149 lb (67.6 kg)   BMI 22.00 kg/m   Physical Exam  Ortho Exam right knee with large effusion.  No significant pain.  Wound is healing without any problem.  Clips removed and Steri-Strips applied after I aspirated 70 cc of bloody fluid.  Fluid appeared to be benign and there is no evidence of infection.  No evidence of instability.  Full extension passively but actively about a 15 degree extensor lag and flexed over 90 degrees.  Neurologically intact  Specialty Comments:  No specialty comments available.  Imaging: XR KNEE 3 VIEW RIGHT  Result Date: 07/18/2021 Films of the right total knee replacement obtained in 3 projections standing.  Nice alignment of the components.  No obvious complications.  No ectopic methacrylate patella tracks in the midline    PMFS History: Patient Active Problem List  Diagnosis Date Noted   Osteoarthritis of right knee 07/04/2021   Total knee replacement status, right 07/04/2021   Unilateral primary osteoarthritis, right knee 06/01/2021   Acute respiratory failure with hypoxia (HCC) 02/12/2021   Syncope and collapse 02/12/2021   Effusion, right knee 02/12/2021   COPD (chronic obstructive pulmonary disease) with emphysema ([facility]) 02/12/2021   Tobacco abuse 02/12/2021   Alcohol use 02/12/2021   Low back pain 12/14/2020   Primary osteoarthritis of left knee 09/25/2016   S/P total knee replacement using cement, left 09/25/2016   Gout 07/29/2012   Past Medical History:  Diagnosis Date    Arthritis    Cancer ([facility])    bladder cancer   COPD (chronic obstructive pulmonary disease) ([facility])    Gout     History reviewed. No pertinent family history.  Past Surgical History:  Procedure Laterality Date   COLONOSCOPY     KNEE SURGERY Right 1990   teeth removal     TOTAL KNEE ARTHROPLASTY Left 09/25/2016   Procedure: TOTAL KNEE ARTHROPLASTY;  Surgeon: Garald Balding, MD;  Location: [facility];  Service: Orthopedics;  Laterality: Left;  block done per Dr. Linna Caprice at [facility] Right 07/04/2021   Procedure: RIGHT TOTAL KNEE ARTHROPLASTY;  Surgeon: Garald Balding, MD;  Location: [facility];  Service: Orthopedics;  Laterality: Right;   Social History   Occupational History   Not on file  Tobacco Use   Smoking status: Every Day    Packs/day: 0.75    Years: 40.00    Pack years: 30.00    Types: Cigarettes   Smokeless tobacco: Never  Vaping Use   Vaping Use: Never used  Substance and Sexual Activity   Alcohol use: Yes    Alcohol/week: 14.0 standard drinks    Types: 14 Standard drinks or equivalent per week    Comment: usually on weekends   Drug use: No   Sexual activity: Not on file

## 2021-07-19 ENCOUNTER — Telehealth: Payer: Self-pay | Admitting: Orthopaedic Surgery

## 2021-07-19 ENCOUNTER — Other Ambulatory Visit: Payer: Self-pay | Admitting: Orthopaedic Surgery

## 2021-07-19 MED ORDER — HYDROCODONE-ACETAMINOPHEN 5-325 MG PO TABS: 1.0000 | ORAL_TABLET | Freq: Four times a day (QID) | ORAL | 0 refills | Status: AC | PRN

## 2021-07-19 NOTE — Telephone Encounter (Signed)
Pt called stating he is in a lot of pain and would like to know if its possible to get a rx for hydrocodone sent in? Pt would like a CB with an answer.   [phone]

## 2021-07-19 NOTE — Telephone Encounter (Signed)
Sent in hydrocodone

## 2021-07-25 ENCOUNTER — Ambulatory Visit (INDEPENDENT_AMBULATORY_CARE_PROVIDER_SITE_OTHER): Payer: Medicare Other | Admitting: Orthopaedic Surgery

## 2021-07-25 ENCOUNTER — Encounter: Payer: Self-pay | Admitting: Orthopaedic Surgery

## 2021-07-25 ENCOUNTER — Other Ambulatory Visit: Payer: Self-pay

## 2021-07-25 DIAGNOSIS — Z96651 Presence of right artificial knee joint: Secondary | ICD-10-CM

## 2021-07-25 NOTE — Progress Notes (Signed)
Office Visit Note   Patient: Edwin Hudson           Date of Birth: 12/09/1956           MRN: [id number] Visit Date: 07/25/2021              Requested by: Reita May, NP [address] [place],  [place] [address] PCP: Reita May, NP   Assessment & Plan: Visit Diagnoses:  1. Total knee replacement status, right     Plan: 3 weeks status post primary right total knee replacement.  Seen last week for large effusion that was predominantly old blood.  Still having little bit of swelling and he was concerned but no fever or chills.  There is some recurrent effusion but not nearly to the extent that he had last week.  He has an extensor lag but I could fully extend his knee and flex to 105.  No instability.  His thigh musculature is very weak and I think that is going to be a problem.  He is having some trouble at night with spasm and soreness.  He might want to try some Advil.  Check him back in the next several weeks.  Start outpatient therapy here  Follow-Up Instructions: Return As scheduled.   Orders:  No orders of the defined types were placed in this encounter.  No orders of the defined types were placed in this encounter.     Procedures: No procedures performed   Clinical Data: No additional findings.   Subjective: Chief Complaint  Patient presents with   Right Knee - Follow-up    Right total knee arthroplasty 07/04/2021  Patient presents today for a one week follow up on his right knee. He is now three weeks out from a right total knee arthroplasty. His surgery was on 07/04/2021. He states that it has swollen up again. He had it aspirated at his visit last week.  Denies any fever or chills shortness of breath or chest pain  HPI  Review of Systems   Objective: Vital Signs: There were no vitals taken for this visit.  Physical Exam  Ortho Exam right knee incision healing without problem.  At full extension passively but lacks about 10 to 15 degrees to  full extension actively.  Quad musculature very weak and atrophic compared to the left.  No calf pain or distal edema.  No knee instability.  Knee was warm but not unusually hot. positive effusion  Specialty Comments:  No specialty comments available.  Imaging: No results found.   PMFS History: Patient Active Problem List   Diagnosis Date Noted   Osteoarthritis of right knee 07/04/2021   Total knee replacement status, right 07/04/2021   Unilateral primary osteoarthritis, right knee 06/01/2021   Acute respiratory failure with hypoxia ([facility]) 02/12/2021   Syncope and collapse 02/12/2021   Effusion, right knee 02/12/2021   COPD (chronic obstructive pulmonary disease) with emphysema ([facility]) 02/12/2021   Tobacco abuse 02/12/2021   Alcohol use 02/12/2021   Low back pain 12/14/2020   Primary osteoarthritis of left knee 09/25/2016   S/P total knee replacement using cement, left 09/25/2016   Gout 07/29/2012   Past Medical History:  Diagnosis Date   Arthritis    Cancer ([facility])    bladder cancer   COPD (chronic obstructive pulmonary disease) ([facility])    Gout     History reviewed. No pertinent family history.  Past Surgical History:  Procedure Laterality Date   COLONOSCOPY  KNEE SURGERY Right 1990   teeth removal     TOTAL KNEE ARTHROPLASTY Left 09/25/2016   Procedure: TOTAL KNEE ARTHROPLASTY;  Surgeon: Garald Balding, MD;  Location: [facility];  Service: Orthopedics;  Laterality: Left;  block done per Dr. Linna Caprice at [facility] Right 07/04/2021   Procedure: RIGHT TOTAL KNEE ARTHROPLASTY;  Surgeon: Garald Balding, MD;  Location: [facility];  Service: Orthopedics;  Laterality: Right;   Social History   Occupational History   Not on file  Tobacco Use   Smoking status: Every Day    Packs/day: 0.75    Years: 40.00    Pack years: 30.00    Types: Cigarettes   Smokeless tobacco: Never  Vaping Use   Vaping Use: Never used  Substance and Sexual Activity   Alcohol use:  Yes    Alcohol/week: 14.0 standard drinks    Types: 14 Standard drinks or equivalent per week    Comment: usually on weekends   Drug use: No   Sexual activity: Not on file

## 2021-07-27 ENCOUNTER — Other Ambulatory Visit: Payer: Self-pay | Admitting: Orthopaedic Surgery

## 2021-07-27 ENCOUNTER — Telehealth: Payer: Self-pay | Admitting: Orthopaedic Surgery

## 2021-07-27 MED ORDER — HYDROCODONE-ACETAMINOPHEN 5-325 MG PO TABS: 1.0000 | ORAL_TABLET | Freq: Three times a day (TID) | ORAL | 0 refills | Status: AC | PRN

## 2021-07-27 MED ORDER — HYDROCODONE-ACETAMINOPHEN 5-325 MG PO TABS: 1.0000 | ORAL_TABLET | Freq: Four times a day (QID) | ORAL | 0 refills | Status: DC | PRN

## 2021-07-27 NOTE — Telephone Encounter (Signed)
sent 

## 2021-07-27 NOTE — Telephone Encounter (Signed)
Patient called advised the Aleve and Tylenol is not working. Patient asked if he can get a RX for Hydrocodone? The number to contact patient [phone]

## 2021-08-01 ENCOUNTER — Ambulatory Visit (INDEPENDENT_AMBULATORY_CARE_PROVIDER_SITE_OTHER): Payer: Medicare Other | Admitting: Orthopaedic Surgery

## 2021-08-01 ENCOUNTER — Encounter: Payer: Self-pay | Admitting: Orthopaedic Surgery

## 2021-08-01 ENCOUNTER — Other Ambulatory Visit: Payer: Self-pay

## 2021-08-01 VITALS — Ht 69.0 in | Wt 149.0 lb

## 2021-08-01 DIAGNOSIS — Z96651 Presence of right artificial knee joint: Secondary | ICD-10-CM

## 2021-08-01 DIAGNOSIS — M1711 Unilateral primary osteoarthritis, right knee: Secondary | ICD-10-CM

## 2021-08-01 NOTE — Progress Notes (Signed)
Office Visit Note   Patient: Edwin Hudson           Date of Birth: Apr 02, 1956           MRN: [id number] Visit Date: 08/01/2021              Requested by: Reita May, NP [address] [place],  [place] [address] PCP: Reita May, NP   Assessment & Plan: Visit Diagnoses:  1. Primary osteoarthritis of right knee   2. Total knee replacement status, right     Plan: 1 month status post primary right total knee replacement and continuing to progress nicely.  No fever or chills.  Takes hydrocodone at night.  Starts outpatient therapy tomorrow.  Has 105 degrees of flexion.  Has about a 15 degree extensor lag but passively I can correct to neutral.  Still has a small effusion but no particular pain to palpation.  No instability.  Continue with his exercises plan to see him back in a month.  Ask him to try Aleve and Tylenol before he takes the hydrocodone  Follow-Up Instructions: Return in about 1 month (around 09/01/2021).   Orders:  No orders of the defined types were placed in this encounter.  No orders of the defined types were placed in this encounter.     Procedures: No procedures performed   Clinical Data: No additional findings.   Subjective: Chief Complaint  Patient presents with   Right Knee - Follow-up    Right total knee arthroplasty 07/04/2021  Patient presents today for follow up on his right knee. He had a right total knee arthroplasty on 07/04/2021. He is now 4 weeks out from surgery. He is walking without crutches. He starts physical therapy next week. He is taking hydrocodone in the morning and at night.   HPI  Review of Systems   Objective: Vital Signs: Ht '5\' 9"'$  (1.753 m)   Wt 149 lb (67.6 kg)   BMI 22.00 kg/m   Physical Exam  Ortho Exam right knee was slightly warm compared to the left but not unusual postop appearance.  Still has a positive effusion.  Still having some thigh pain related to the tourniquet.  He had better extension but  still lacks about 15 degrees actively.  Full extension passively.  105 degrees of flexion.  I suspect that the effusion is creating some of the extension lag.  No instability.  No calf pain or distal edema.  Incision is healed very nicely without problem  Specialty Comments:  No specialty comments available.  Imaging: No results found.   PMFS History: Patient Active Problem List   Diagnosis Date Noted   Osteoarthritis of right knee 07/04/2021   Total knee replacement status, right 07/04/2021   Unilateral primary osteoarthritis, right knee 06/01/2021   Acute respiratory failure with hypoxia ([place]) 02/12/2021   Syncope and collapse 02/12/2021   Effusion, right knee 02/12/2021   COPD (chronic obstructive pulmonary disease) with emphysema ([place]) 02/12/2021   Tobacco abuse 02/12/2021   Alcohol use 02/12/2021   Low back pain 12/14/2020   Primary osteoarthritis of left knee 09/25/2016   S/P total knee replacement using cement, left 09/25/2016   Gout 07/29/2012   Past Medical History:  Diagnosis Date   Arthritis    Cancer ([facility])    bladder cancer   COPD (chronic obstructive pulmonary disease) ([place])    Gout     History reviewed. No pertinent family history.  Past Surgical History:  Procedure  Laterality Date   COLONOSCOPY     KNEE SURGERY Right 1990   teeth removal     TOTAL KNEE ARTHROPLASTY Left 09/25/2016   Procedure: TOTAL KNEE ARTHROPLASTY;  Surgeon: Garald Balding, MD;  Location: [facility];  Service: Orthopedics;  Laterality: Left;  block done per Dr. Linna Caprice at [facility] Right 07/04/2021   Procedure: RIGHT TOTAL KNEE ARTHROPLASTY;  Surgeon: Garald Balding, MD;  Location[facility];  Service: Orthopedics;  Laterality: Right;   Social History   Occupational History   Not on file  Tobacco Use   Smoking status: Every Day    Packs/day: 0.75    Years: 40.00    Pack years: 30.00    Types: Cigarettes   Smokeless tobacco: Never  Vaping Use   Vaping Use:  Never used  Substance and Sexual Activity   Alcohol use: Yes    Alcohol/week: 14.0 standard drinks    Types: 14 Standard drinks or equivalent per week    Comment: usually on weekends   Drug use: No   Sexual activity: Not on file

## 2021-08-02 ENCOUNTER — Ambulatory Visit: Payer: Medicare Other | Admitting: Physical Therapy

## 2021-08-02 ENCOUNTER — Encounter: Payer: Self-pay | Admitting: Physical Therapy

## 2021-08-02 DIAGNOSIS — R2689 Other abnormalities of gait and mobility: Secondary | ICD-10-CM | POA: Diagnosis not present

## 2021-08-02 DIAGNOSIS — R600 Localized edema: Secondary | ICD-10-CM

## 2021-08-02 DIAGNOSIS — R2681 Unsteadiness on feet: Secondary | ICD-10-CM

## 2021-08-02 DIAGNOSIS — M25661 Stiffness of right knee, not elsewhere classified: Secondary | ICD-10-CM | POA: Diagnosis not present

## 2021-08-02 DIAGNOSIS — M25561 Pain in right knee: Secondary | ICD-10-CM

## 2021-08-02 DIAGNOSIS — M6281 Muscle weakness (generalized): Secondary | ICD-10-CM

## 2021-08-02 NOTE — Therapy (Signed)
[facility] [address] [place], [place], [address] Phone: [phone]   Fax:  [phone]  Physical Therapy Evaluation  Patient Details  Name: Edwin Hudson MRN: [id number] Date of Birth: 1956-10-04 Referring Provider (PT): Garald Balding, MD   Encounter Date: 08/02/2021   PT End of Session - 08/02/21 1546     Visit Number 1    Number of Visits 8   may decrease to biweekly due to financial limitations   Date for PT Re-Evaluation 09/27/21    Authorization Type UHC Medicare $30 copay    PT Start Time 1516    PT Stop Time 1545    PT Time Calculation (min) 29 min    Activity Tolerance Patient tolerated treatment well    Behavior During Therapy [facility] for tasks assessed/performed             Past Medical History:  Diagnosis Date   Arthritis    Cancer ([place])    bladder cancer   COPD (chronic obstructive pulmonary disease) ([place])    Gout     Past Surgical History:  Procedure Laterality Date   COLONOSCOPY     KNEE SURGERY Right 1990   teeth removal     TOTAL KNEE ARTHROPLASTY Left 09/25/2016   Procedure: TOTAL KNEE ARTHROPLASTY;  Surgeon: Garald Balding, MD;  Location: [facility];  Service: Orthopedics;  Laterality: Left;  block done per Dr. Linna Caprice at [facility] Right 07/04/2021   Procedure: RIGHT TOTAL KNEE ARTHROPLASTY;  Surgeon: Garald Balding, MD;  Location[facility];  Service: Orthopedics;  Laterality: Right;    There were no vitals filed for this visit.    Subjective Assessment - 08/02/21 1519     Subjective Pt is a 65 y/o male who presents to OPPT s/p Rt TKA on 07/04/21.  He occasionally uses a RW, and had HHPT for 5 visits.  He c/o continued swelling    Pertinent History gout, oa, copd, bladder ca    Limitations Standing;Walking    Patient Stated Goals improve function, return to regular function    Currently in Pain? Yes    Pain Score 5    up to 7/10; at best 4/10   Pain Location Knee    Pain  Orientation Right    Pain Descriptors / Indicators Aching    Pain Type Acute pain;Surgical pain    Pain Radiating Towards worse at night and in early AM    Pain Onset More than a month ago    Pain Frequency Constant    Aggravating Factors  mornings, nighttime, bending    Pain Relieving Factors repositioning, ice                [facility] PT Assessment - 08/02/21 1516       Assessment   Medical Diagnosis Z96.651 (ICD-10-CM) - Total knee replacement status, right    Referring Provider (PT) Garald Balding, MD    Onset Date/Surgical Date 07/04/21    Hand Dominance Right    Next MD Visit 08/29/21    Prior Therapy HHPT x 5 visits      Precautions   Precautions None      Restrictions   Weight Bearing Restrictions No      Balance Screen   Has the patient fallen in the past 6 months No    Has the patient had a decrease in activity level because of a fear of falling?  No    Is  the patient reluctant to leave their home because of a fear of falling?  No      Home Environment   Living Environment Private residence    Living Arrangements Alone    Type of [place] to enter    Entrance Stairs-Number of Steps 2   5 steps with handrails on front, can reach both   Entrance Stairs-Rails None    Home Layout One level      Prior Function   Level of Independence Independent    Vocation Retired    Biomedical scientist retired from [employer]    Leisure sit outside with neighbors, no regular exercise (occasional walking)      Cognition   Overall Cognitive Status Within Functional Limits for tasks assessed      Observation/Other Assessments   Focus on Therapeutic Outcomes (FOTO)  40 (predicted 64)      ROM / Strength   AROM / PROM / Strength AROM;PROM;Strength      AROM   AROM Assessment Site Knee    Right/Left Knee Right    Right Knee Extension -14    Right Knee Flexion 98      PROM   PROM Assessment Site Knee    Right/Left Knee Right     Right Knee Extension 0    Right Knee Flexion 111      Strength   Overall Strength Comments Rt knee 3-/5      Palpation   Palpation comment moderate swelling noted in Rt knee      Ambulation/Gait   Assistive device None    Gait Pattern Decreased stance time - right;Decreased step length - left;Decreased hip/knee flexion - right   decreased TKE on Rt and RLE externally rotated                       Objective measurements completed on examination: See above findings.       [facility] Adult PT Treatment/Exercise - 08/02/21 1516       Exercises   Exercises Other Exercises    Other Exercises  see pt instructions - performed 3-5 reps of each exercise with mod cues                    PT Education - 08/02/21 1546     Education Details HEP    Person(s) Educated Patient    Methods Explanation;Demonstration;Handout    Comprehension Verbalized understanding;Returned demonstration;Need further instruction              PT Short Term Goals - 08/02/21 1547       PT SHORT TERM GOAL #1   Title Independent with initial HEP    Time 4    Period Weeks    Status New    Target Date 08/30/21      PT SHORT TERM GOAL #2   Title Rt knee AROM improved 0-100 for improved function    Time 4    Period Weeks    Status New    Target Date 08/30/21               PT Long Term Goals - 08/02/21 1550       PT LONG TERM GOAL #1   Title Independent with final HEP    Time 8    Period Weeks    Status New    Target Date 09/27/21      PT LONG TERM GOAL #  2   Title Rt knee AROM improved 0-110 for improved function    Time 8    Period Weeks    Status New    Target Date 09/27/21      PT LONG TERM GOAL #3   Title Amb independently without significant deviations for improved function    Time 8    Period Weeks    Status New    Target Date 09/27/21      PT LONG TERM GOAL #4   Title Report pain < 2/10 for improved function    Time 8    Period Weeks    Status  New    Target Date 09/27/21      PT LONG TERM GOAL #5   Title FOTO score improved to 64 for improved function    Time 8    Period Weeks    Status New    Target Date 09/27/21                    Plan - 08/02/21 1546     Clinical Impression Statement Pt is a 64y/o male who presents to OPPT s/p Rt TKA on 07/04/21.  He demonstrates decreased strength, ROM, increased edema and pain with gait abnormalities affecting functional mobility.  Pt will benefit from PT to address deficits listed. Recommend PT 2x/wk x 6-8 weeks, however pt unable to afford copay at that frequency so will see 1x/wk.    Personal Factors and Comorbidities Comorbidity 3+;Finances    Comorbidities gout, oa, copd, bladder ca    Examination-Activity Limitations Squat;Stairs;Stand;Locomotion Level    Examination-Participation Restrictions Cleaning;Community Activity;Driving;Yard Work    Stability/Clinical Decision Making Evolving/Moderate complexity    Clinical Decision Making Moderate    Rehab Potential Good    PT Frequency 1x / week   1x/wk x 4 wks then may decr to biweekly   PT Duration 8 weeks    PT Treatment/Interventions ADLs/Self Care Home Management;Cryotherapy;Electrical Stimulation;Moist Heat;Balance training;Therapeutic exercise;Therapeutic activities;Functional mobility training;Stair training;Gait training;Neuromuscular re-education;Patient/family education;Manual techniques;Vasopneumatic Device;Taping;Dry needling;Passive range of motion    PT Next Visit Plan review HEP, continue with ROM work and initiate strengthening, balance; may need some additional gait training    PT Home Exercise Plan Access Code: [email or web address]    Consulted and Agree with Plan of Care Patient             Patient will benefit from skilled therapeutic intervention in order to improve the following deficits and impairments:  Abnormal gait, Pain, Decreased strength, Difficulty walking, Decreased mobility, Decreased balance, Decreased  range of motion, Postural dysfunction, Increased fascial restricitons, Increased muscle spasms, Increased edema  Visit Diagnosis: Acute pain of right knee - Plan: PT plan of care cert/re-cert  Stiffness of right knee, not elsewhere classified - Plan: PT plan of care cert/re-cert  Other abnormalities of gait and mobility - Plan: PT plan of care cert/re-cert  Unsteadiness on feet - Plan: PT plan of care cert/re-cert  Muscle weakness (generalized) - Plan: PT plan of care cert/re-cert  Localized edema - Plan: PT plan of care cert/re-cert     Problem List Patient Active Problem List   Diagnosis Date Noted   Osteoarthritis of right knee 07/04/2021   Total knee replacement status, right 07/04/2021   Unilateral primary osteoarthritis, right knee 06/01/2021   Acute respiratory failure with hypoxia ([place]) 02/12/2021   Syncope and collapse 02/12/2021   Effusion, right knee 02/12/2021   COPD (chronic obstructive pulmonary disease) with emphysema ([place]) 02/12/2021  Tobacco abuse 02/12/2021   Alcohol use 02/12/2021   Low back pain 12/14/2020   Primary osteoarthritis of left knee 09/25/2016   S/P total knee replacement using cement, left 09/25/2016   Gout 07/29/2012      Laureen Abrahams, PT, DPT 08/02/21 3:53 PM      [facility] [address] [place], [place], [address] Phone: [phone]   Fax:  [phone]  Name: Edwin Hudson MRN: [id number] Date of Birth: January 06, 1956

## 2021-08-02 NOTE — Patient Instructions (Signed)
Access Code: [email or web address] URL: [email or web address] Date: 08/02/2021 Prepared by: Faustino Congress  Exercises Seated Long Arc Quad - 3-5 x daily - 7 x weekly - 1 sets - 10 reps - 10 sec hold Seated Knee Flexion AAROM - 3-5 x daily - 7 x weekly - 1 sets - 10 reps - 10 sec hold Quad Setting and Stretching - 8-10 x daily - 7 x weekly - 1 sets - 5-10 reps - 5 sec hold Supine Heel Slide with Strap - 8-10 x daily - 7 x weekly - 1 sets - 5-10 reps - 5 sec hold Supine Knee Extension Mobilization with Weight - 3-5 x daily - 7 x weekly - 1 sets - 1 reps - 3-5 min hold

## 2021-08-16 ENCOUNTER — Ambulatory Visit (INDEPENDENT_AMBULATORY_CARE_PROVIDER_SITE_OTHER): Payer: Medicare Other | Admitting: Physical Therapy

## 2021-08-16 ENCOUNTER — Other Ambulatory Visit: Payer: Self-pay

## 2021-08-16 ENCOUNTER — Encounter: Payer: Self-pay | Admitting: Physical Therapy

## 2021-08-16 DIAGNOSIS — R2689 Other abnormalities of gait and mobility: Secondary | ICD-10-CM

## 2021-08-16 DIAGNOSIS — R2681 Unsteadiness on feet: Secondary | ICD-10-CM | POA: Diagnosis not present

## 2021-08-16 DIAGNOSIS — M6281 Muscle weakness (generalized): Secondary | ICD-10-CM

## 2021-08-16 DIAGNOSIS — R600 Localized edema: Secondary | ICD-10-CM

## 2021-08-16 DIAGNOSIS — M25561 Pain in right knee: Secondary | ICD-10-CM

## 2021-08-16 DIAGNOSIS — M25661 Stiffness of right knee, not elsewhere classified: Secondary | ICD-10-CM

## 2021-08-16 NOTE — Therapy (Signed)
[facility] Therapy [address] [place], [place], [address] Phone: [phone]   Fax:  [phone]  Physical Therapy Treatment  Patient Details  Name: Edwin Hudson MRN: [id number] Date of Birth: 11-Jun-1956 Referring Provider (PT): Garald Balding, MD   Encounter Date: 08/16/2021   PT End of Session - 08/16/21 1353     Visit Number 2    Number of Visits 8    Date for PT Re-Evaluation 09/27/21    Authorization Type UHC Medicare $30 copay    PT Start Time 1351    PT Stop Time 1436    PT Time Calculation (min) 45 min    Activity Tolerance Patient tolerated treatment well    Behavior During Therapy [facility] for tasks assessed/performed             Past Medical History:  Diagnosis Date   Arthritis    Cancer ([place])    bladder cancer   COPD (chronic obstructive pulmonary disease) ([place])    Gout     Past Surgical History:  Procedure Laterality Date   COLONOSCOPY     KNEE SURGERY Right 1990   teeth removal     TOTAL KNEE ARTHROPLASTY Left 09/25/2016   Procedure: TOTAL KNEE ARTHROPLASTY;  Surgeon: Garald Balding, MD;  Location: [facility];  Service: Orthopedics;  Laterality: Left;  block done per Dr. Linna Caprice at [facility] Right 07/04/2021   Procedure: RIGHT TOTAL KNEE ARTHROPLASTY;  Surgeon: Garald Balding, MD;  [facility];  Service: Orthopedics;  Laterality: Right;    There were no vitals filed for this visit.   Subjective Assessment - 08/16/21 1352     Subjective Patient reports no pain. He hasn't been doing his exercises because he hasn't been able to sleep. He has been doing a lot of walking.    Pertinent History gout, oa, copd, bladder ca    Patient Stated Goals improve function, return to regular function    Currently in Pain? No/denies                [facility] PT Assessment - 08/16/21 0001       AROM   AROM Assessment Site Knee    Right/Left Knee Right    Right Knee Extension -6    Right Knee  Flexion 105      PROM   PROM Assessment Site Knee    Right/Left Knee Right    Right Knee Extension 0    Right Knee Flexion 122                           [facility] Adult PT Treatment/Exercise - 08/16/21 0001       Exercises   Exercises Knee/Hip      Knee/Hip Exercises: Stretches   Knee: Self-Stretch to increase Flexion Right    Knee: Self-Stretch Limitations lunge 20 reps on step    Other Knee/Hip Stretches reviewed seated knee flex stretch, LAQ and quad sets      Knee/Hip Exercises: Aerobic   Stationary Bike Bike L0 x 5 min      Knee/Hip Exercises: Machines for Strengthening   Cybex Knee Extension 5# x 10 just Rt; Also 10# bil ext with eccentric Rt lower x 10    Cybex Knee Flexion 20# 3x10      Knee/Hip Exercises: Standing   Hip Flexion Both;20 reps;Knee bent    Hip Flexion Limitations no UE support  Terminal Knee Extension Right;10 reps;Theraband    Theraband Level (Terminal Knee Extension) Level 3 (Green)    Terminal Knee Extension Limitations 5 sec hold    Hip Abduction Both;20 reps;Knee straight    Abduction Limitations cues to keep right hip IR or straight    Rocker Board 1 minute      Knee/Hip Exercises: Seated   Sit to Sand 10 reps   from chair with foam pad     Manual Therapy   Manual Therapy Passive ROM    Passive ROM into extension                    PT Education - 08/16/21 1440     Education Details HEP progressed    Person(s) Educated Patient    Methods Explanation;Demonstration;Handout    Comprehension Verbalized understanding;Returned demonstration              PT Short Term Goals - 08/02/21 1547       PT SHORT TERM GOAL #1   Title Independent with initial HEP    Time 4    Period Weeks    Status New    Target Date 08/30/21      PT SHORT TERM GOAL #2   Title Rt knee AROM improved 0-100 for improved function    Time 4    Period Weeks    Status New    Target Date 08/30/21               PT Long Term  Goals - 08/02/21 1550       PT LONG TERM GOAL #1   Title Independent with final HEP    Time 8    Period Weeks    Status New    Target Date 09/27/21      PT LONG TERM GOAL #2   Title Rt knee AROM improved 0-110 for improved function    Time 8    Period Weeks    Status New    Target Date 09/27/21      PT LONG TERM GOAL #3   Title Amb independently without significant deviations for improved function    Time 8    Period Weeks    Status New    Target Date 09/27/21      PT LONG TERM GOAL #4   Title Report pain < 2/10 for improved function    Time 8    Period Weeks    Status New    Target Date 09/27/21      PT LONG TERM GOAL #5   Title FOTO score improved to 64 for improved function    Time 8    Period Weeks    Status New    Target Date 09/27/21                   Plan - 08/16/21 1438     Clinical Impression Statement Patient is progressing well with ROM and strength. Complained of some knee cap discomfort with knee extension machine. Able to correct hip ER in gait with VCs. Some LOB with SLS when marching. LTGs are ongoing.    Personal Factors and Comorbidities Comorbidity 3+;Finances    Comorbidities gout, oa, copd, bladder ca    Examination-Activity Limitations Squat;Stairs;Stand;Locomotion Level    Examination-Participation Restrictions Cleaning;Community Activity;Driving;Yard Work    PT Frequency 1x / week    PT Duration 8 weeks    PT Treatment/Interventions ADLs/Self Care Home Management;Cryotherapy;Electrical Stimulation;Moist Heat;Balance training;Therapeutic exercise;Therapeutic  activities;Functional mobility training;Stair training;Gait training;Neuromuscular re-education;Patient/family education;Manual techniques;Vasopneumatic Device;Taping;Dry needling;Passive range of motion    PT Next Visit Plan continue with ROM work and initiate strengthening, balance; may need some additional gait training    PT Home Exercise Plan Access Code: [email or web address]     Consulted and Agree with Plan of Care Patient             Patient will benefit from skilled therapeutic intervention in order to improve the following deficits and impairments:  Abnormal gait, Pain, Decreased strength, Difficulty walking, Decreased mobility, Decreased balance, Decreased range of motion, Postural dysfunction, Increased fascial restricitons, Increased muscle spasms, Increased edema  Visit Diagnosis: Acute pain of right knee  Stiffness of right knee, not elsewhere classified  Other abnormalities of gait and mobility  Unsteadiness on feet  Muscle weakness (generalized)  Localized edema     Problem List Patient Active Problem List   Diagnosis Date Noted   Osteoarthritis of right knee 07/04/2021   Total knee replacement status, right 07/04/2021   Unilateral primary osteoarthritis, right knee 06/01/2021   Acute respiratory failure with hypoxia ([place]) 02/12/2021   Syncope and collapse 02/12/2021   Effusion, right knee 02/12/2021   COPD (chronic obstructive pulmonary disease) with emphysema ([place]) 02/12/2021   Tobacco abuse 02/12/2021   Alcohol use 02/12/2021   Low back pain 12/14/2020   Primary osteoarthritis of left knee 09/25/2016   S/P total knee replacement using cement, left 09/25/2016   Gout 07/29/2012   Madelyn Flavors PT 08/16/2021, 2:43 PM  [facility] [address] [place], [place], [address] Phone: [phone]   Fax:  [phone]  Name: TRAYLIN FILBERT MRN: [id number] Date of Birth: 23-Mar-1956

## 2021-08-16 NOTE — Patient Instructions (Signed)
Access Code: [email or web address] URL: [email or web address] Date: 08/16/2021 Prepared by: Almyra Free  Exercises Seated Long Arc Quad - 3-5 x daily - 7 x weekly - 1 sets - 10 reps - 10 sec hold Seated Knee Flexion AAROM - 3-5 x daily - 7 x weekly - 1 sets - 10 reps - 10 sec hold Quad Setting and Stretching - 8-10 x daily - 7 x weekly - 1 sets - 5-10 reps - 5 sec hold Supine Heel Slide with Strap - 8-10 x daily - 7 x weekly - 1 sets - 5-10 reps - 5 sec hold Supine Knee Extension Mobilization with Weight - 3-5 x daily - 7 x weekly - 1 sets - 1 reps - 3-5 min hold Standing Hip Abduction - 1 x daily - 7 x weekly - 3 sets - 10 reps Standing Knee Flexion Stretch on Step - 1 x daily - 7 x weekly - 2 sets - 10 reps

## 2021-08-21 ENCOUNTER — Encounter: Payer: Self-pay | Admitting: Physical Therapy

## 2021-08-21 ENCOUNTER — Other Ambulatory Visit: Payer: Self-pay

## 2021-08-21 ENCOUNTER — Ambulatory Visit (INDEPENDENT_AMBULATORY_CARE_PROVIDER_SITE_OTHER): Payer: Medicare Other | Admitting: Physical Therapy

## 2021-08-21 DIAGNOSIS — R2681 Unsteadiness on feet: Secondary | ICD-10-CM

## 2021-08-21 DIAGNOSIS — M25661 Stiffness of right knee, not elsewhere classified: Secondary | ICD-10-CM | POA: Diagnosis not present

## 2021-08-21 DIAGNOSIS — R2689 Other abnormalities of gait and mobility: Secondary | ICD-10-CM | POA: Diagnosis not present

## 2021-08-21 DIAGNOSIS — M25561 Pain in right knee: Secondary | ICD-10-CM | POA: Diagnosis not present

## 2021-08-21 DIAGNOSIS — M6281 Muscle weakness (generalized): Secondary | ICD-10-CM

## 2021-08-21 DIAGNOSIS — R600 Localized edema: Secondary | ICD-10-CM

## 2021-08-21 NOTE — Therapy (Signed)
[facility] Therapy [address] [place], [place], [address] Phone: [phone]   Fax:  [phone]  Physical Therapy Treatment  Patient Details  Name: Edwin Hudson MRN: [id number] Date of Birth: 1956/02/19 Referring Provider (PT): Garald Balding, MD   Encounter Date: 08/21/2021   PT End of Session - 08/21/21 1417     Visit Number 3    Number of Visits 8    Date for PT Re-Evaluation 09/27/21    Authorization Type UHC Medicare $30 copay    PT Start Time 1343    PT Stop Time 1422    PT Time Calculation (min) 39 min    Activity Tolerance Patient tolerated treatment well    Behavior During Therapy [facility] for tasks assessed/performed             Past Medical History:  Diagnosis Date   Arthritis    Cancer ([place])    bladder cancer   COPD (chronic obstructive pulmonary disease) ([place])    Gout     Past Surgical History:  Procedure Laterality Date   COLONOSCOPY     KNEE SURGERY Right 1990   teeth removal     TOTAL KNEE ARTHROPLASTY Left 09/25/2016   Procedure: TOTAL KNEE ARTHROPLASTY;  Surgeon: Garald Balding, MD;  Location: [place];  Service: Orthopedics;  Laterality: Left;  block done per Dr. Linna Caprice at [place] Right 07/04/2021   Procedure: RIGHT TOTAL KNEE ARTHROPLASTY;  Surgeon: Garald Balding, MD;  [facility];  Service: Orthopedics;  Laterality: Right;    There were no vitals filed for this visit.   Subjective Assessment - 08/21/21 1344     Subjective knee is still a little painful but not much.  reports doing exercises "every other day."    Pertinent History gout, oa, copd, bladder ca    Patient Stated Goals improve function, return to regular function    Currently in Pain? No/denies                [facility] PT Assessment - 08/21/21 1400       Assessment   Medical Diagnosis Z96.651 (ICD-10-CM) - Total knee replacement status, right    Referring Provider (PT) Garald Balding, MD       AROM   Right Knee Extension -5    Right Knee Flexion 113   after heelslides                          [facility] Adult PT Treatment/Exercise - 08/21/21 1345       Knee/Hip Exercises: Stretches   Gastroc Stretch Both;3 reps;30 seconds    Gastroc Stretch Limitations slant board      Knee/Hip Exercises: Aerobic   Nustep L6 x 8 min      Knee/Hip Exercises: Machines for Strengthening   Cybex Knee Extension RLE only 5# 3x10    Cybex Knee Flexion RLE only 15# 3x10    Cybex Leg Press bil push 3x10 100#; RLE only 37# 3x10      Knee/Hip Exercises: Standing   Functional Squat 2 sets;10 reps;3 seconds    Functional Squat Limitations TRX    Other Standing Knee Exercises RDL 2x10 with 5# KB      Knee/Hip Exercises: Supine   Heel Slides AAROM;Right;20 reps                      PT Short Term Goals - 08/02/21  [place] #1   Title Independent with initial HEP    Time 4    Period Weeks    Status New    Target Date 08/30/21      PT SHORT TERM GOAL #2   Title Rt knee AROM improved 0-100 for improved function    Time 4    Period Weeks    Status New    Target Date 08/30/21               PT Long Term Goals - 08/02/21 1550       PT LONG TERM GOAL #1   Title Independent with final HEP    Time 8    Period Weeks    Status New    Target Date 09/27/21      PT LONG TERM GOAL #2   Title Rt knee AROM improved 0-110 for improved function    Time 8    Period Weeks    Status New    Target Date 09/27/21      PT LONG TERM GOAL #3   Title Amb independently without significant deviations for improved function    Time 8    Period Weeks    Status New    Target Date 09/27/21      PT LONG TERM GOAL #4   Title Report pain < 2/10 for improved function    Time 8    Period Weeks    Status New    Target Date 09/27/21      PT LONG TERM GOAL #5   Title FOTO score improved to 64 for improved function    Time 8    Period Weeks    Status  New    Target Date 09/27/21                   Plan - 08/21/21 1418     Clinical Impression Statement Pt tolerated session well today demonstrating improvement in Rt knee flexion.  Needs reinforcement for daily compliance with HEP to maxmize functional mobility especially as he is only coming 1x/wk.  Will continue to benefit from PT to maximize function.    Personal Factors and Comorbidities Comorbidity 3+;Finances    Comorbidities gout, oa, copd, bladder ca    Examination-Activity Limitations Squat;Stairs;Stand;Locomotion Level    Examination-Participation Restrictions Cleaning;Community Activity;Driving;Yard Work    PT Frequency 1x / week    PT Duration 8 weeks    PT Treatment/Interventions ADLs/Self Care Home Management;Cryotherapy;Electrical Stimulation;Moist Heat;Balance training;Therapeutic exercise;Therapeutic activities;Functional mobility training;Stair training;Gait training;Neuromuscular re-education;Patient/family education;Manual techniques;Vasopneumatic Device;Taping;Dry needling;Passive range of motion    PT Next Visit Plan TKE strengthening, functional strength, balance; may need some additional gait training    PT Home Exercise Plan Access Code: [email or web address]    Consulted and Agree with Plan of Care Patient             Patient will benefit from skilled therapeutic intervention in order to improve the following deficits and impairments:  Abnormal gait, Pain, Decreased strength, Difficulty walking, Decreased mobility, Decreased balance, Decreased range of motion, Postural dysfunction, Increased fascial restricitons, Increased muscle spasms, Increased edema  Visit Diagnosis: Acute pain of right knee  Stiffness of right knee, not elsewhere classified  Other abnormalities of gait and mobility  Unsteadiness on feet  Muscle weakness (generalized)  Localized edema     Problem List Patient Active Problem List   Diagnosis Date Noted   Osteoarthritis of right  knee 07/04/2021   Total knee replacement status, right 07/04/2021   Unilateral primary osteoarthritis, right knee 06/01/2021   Acute respiratory failure with hypoxia ([place]) 02/12/2021   Syncope and collapse 02/12/2021   Effusion, right knee 02/12/2021   COPD (chronic obstructive pulmonary disease) with emphysema ([place]) 02/12/2021   Tobacco abuse 02/12/2021   Alcohol use 02/12/2021   Low back pain 12/14/2020   Primary osteoarthritis of left knee 09/25/2016   S/P total knee replacement using cement, left 09/25/2016   Gout 07/29/2012      Laureen Abrahams, PT, DPT 08/21/21 2:21 PM   [facility] [address] [place], [place], [address] Phone: [phone]   Fax:  [phone]  Name: RAUL HOWALD MRN: [id number] Date of Birth: Apr 28, 1956

## 2021-08-29 ENCOUNTER — Ambulatory Visit (INDEPENDENT_AMBULATORY_CARE_PROVIDER_SITE_OTHER): Payer: Medicare Other | Admitting: Orthopaedic Surgery

## 2021-08-29 ENCOUNTER — Other Ambulatory Visit: Payer: Self-pay

## 2021-08-29 ENCOUNTER — Ambulatory Visit: Payer: Medicare Other | Admitting: Physical Therapy

## 2021-08-29 ENCOUNTER — Encounter: Payer: Self-pay | Admitting: Orthopaedic Surgery

## 2021-08-29 ENCOUNTER — Encounter: Payer: Self-pay | Admitting: Physical Therapy

## 2021-08-29 VITALS — Ht 69.0 in | Wt 149.0 lb

## 2021-08-29 DIAGNOSIS — M25561 Pain in right knee: Secondary | ICD-10-CM

## 2021-08-29 DIAGNOSIS — M25661 Stiffness of right knee, not elsewhere classified: Secondary | ICD-10-CM

## 2021-08-29 DIAGNOSIS — R2689 Other abnormalities of gait and mobility: Secondary | ICD-10-CM

## 2021-08-29 DIAGNOSIS — R2681 Unsteadiness on feet: Secondary | ICD-10-CM | POA: Diagnosis not present

## 2021-08-29 DIAGNOSIS — Z96651 Presence of right artificial knee joint: Secondary | ICD-10-CM

## 2021-08-29 DIAGNOSIS — R600 Localized edema: Secondary | ICD-10-CM

## 2021-08-29 DIAGNOSIS — M6281 Muscle weakness (generalized): Secondary | ICD-10-CM

## 2021-08-29 DIAGNOSIS — M1711 Unilateral primary osteoarthritis, right knee: Secondary | ICD-10-CM

## 2021-08-29 NOTE — Therapy (Signed)
[facility] [address] [place], [place], [address] Phone: [phone]   Fax:  [phone]  Physical Therapy Treatment  Patient Details  Name: Edwin Hudson MRN: [id number] Date of Birth: [birth year] Referring Provider (PT): Garald Balding, MD   Encounter Date: 08/29/2021   PT End of Session - 08/29/21 1340     Visit Number 4    Number of Visits 8    Date for PT Re-Evaluation 09/27/21    Authorization Type UHC Medicare $30 copay    PT Start Time 1300    PT Stop Time 1340    PT Time Calculation (min) 40 min    Activity Tolerance Patient tolerated treatment well    Behavior During Therapy [facility] for tasks assessed/performed             Past Medical History:  Diagnosis Date   Arthritis    Cancer ([place])    bladder cancer   COPD (chronic obstructive pulmonary disease) ([place])    Gout     Past Surgical History:  Procedure Laterality Date   COLONOSCOPY     KNEE SURGERY Right 1990   teeth removal     TOTAL KNEE ARTHROPLASTY Left 09/25/2016   Procedure: TOTAL KNEE ARTHROPLASTY;  Surgeon: Garald Balding, MD;  Location: [place];  Service: Orthopedics;  Laterality: Left;  block done per Dr. Linna Caprice at [place] Right 07/04/2021   Procedure: RIGHT TOTAL KNEE ARTHROPLASTY;  Surgeon: Garald Balding, MD;  [facility];  Service: Orthopedics;  Laterality: Right;    There were no vitals filed for this visit.   Subjective Assessment - 08/29/21 1304     Subjective doing exercises 1x/day; "not as much as I should."    Pertinent History gout, oa, copd, bladder ca    Patient Stated Goals improve function, return to regular function    Currently in Pain? Yes    Pain Score 2     Pain Location Knee    Pain Orientation Right    Pain Descriptors / Indicators Aching    Pain Type Acute pain;Surgical pain    Pain Onset More than a month ago    Pain Frequency Constant    Aggravating Factors  morning, nighttime, bending     Pain Relieving Factors repositioning, ice                [facility] PT Assessment - 08/29/21 1321       AROM   Right Knee Extension -2    Right Knee Flexion 113      PROM   Right Knee Extension 0    Right Knee Flexion 121                           [facility] Adult PT Treatment/Exercise - 08/29/21 1306       Knee/Hip Exercises: Aerobic   Recumbent Bike seat 5, L4 x 8 min      Knee/Hip Exercises: Machines for Strengthening   Cybex Knee Extension RLE only 5# 3x10    Cybex Knee Flexion RLE only 15# 3x10    Cybex Leg Press bil push 3x10 100#; RLE only 50# 3x10      Knee/Hip Exercises: Standing   Forward Step Up Both;10 reps;Hand Hold: 0;Step Height: 6"                      PT Short Term Goals - 08/29/21 1340  PT SHORT TERM GOAL #1   Title Independent with initial HEP    Time 4    Period Weeks    Status Achieved    Target Date 08/30/21      PT SHORT TERM GOAL #2   Title Rt knee AROM improved 0-100 for improved function    Baseline 9/6: 0-2-113    Time 4    Period Weeks    Status Partially Met    Target Date 08/30/21               PT Long Term Goals - 08/29/21 1340       PT LONG TERM GOAL #1   Title Independent with final HEP    Time 8    Period Weeks    Status On-going    Target Date 09/27/21      PT LONG TERM GOAL #2   Title Rt knee AROM improved 0-110 for improved function    Time 8    Period Weeks    Status On-going      PT LONG TERM GOAL #3   Title Amb independently without significant deviations for improved function    Time 8    Period Weeks    Status On-going      PT LONG TERM GOAL #4   Title Report pain < 2/10 for improved function    Time 8    Period Weeks    Status On-going      PT LONG TERM GOAL #5   Title FOTO score improved to 64 for improved function    Time 8    Period Weeks    Status On-going                   Plan - 08/29/21 1341     Clinical Impression Statement Pt has  met STG #1 and partially met STG #2, only limited on extension by 2 deg at this time.  Overall progressing well with PT and anticipate he will not need many more sessions due to excellent progress.  Plan to update HEP in preparation for d/c next 2 visits.    Personal Factors and Comorbidities Comorbidity 3+;Finances    Comorbidities gout, oa, copd, bladder ca    Examination-Activity Limitations Squat;Stairs;Stand;Locomotion Level    Examination-Participation Restrictions Cleaning;Community Activity;Driving;Yard Work    PT Frequency 1x / week    PT Duration 8 weeks    PT Treatment/Interventions ADLs/Self Care Home Management;Cryotherapy;Electrical Stimulation;Moist Heat;Balance training;Therapeutic exercise;Therapeutic activities;Functional mobility training;Stair training;Gait training;Neuromuscular re-education;Patient/family education;Manual techniques;Vasopneumatic Device;Taping;Dry needling;Passive range of motion    PT Next Visit Plan TKE strengthening, functional strength, balance; update HEP (add strengtheing/balance)    PT Home Exercise Plan Access Code: [email or web address]    Consulted and Agree with Plan of Care Patient             Patient will benefit from skilled therapeutic intervention in order to improve the following deficits and impairments:  Abnormal gait, Pain, Decreased strength, Difficulty walking, Decreased mobility, Decreased balance, Decreased range of motion, Postural dysfunction, Increased fascial restricitons, Increased muscle spasms, Increased edema  Visit Diagnosis: Acute pain of right knee  Stiffness of right knee, not elsewhere classified  Other abnormalities of gait and mobility  Unsteadiness on feet  Muscle weakness (generalized)  Localized edema     Problem List Patient Active Problem List   Diagnosis Date Noted   Osteoarthritis of right knee 07/04/2021   Total knee replacement status, right 07/04/2021   Unilateral primary osteoarthritis,  right knee  06/01/2021   Acute respiratory failure with hypoxia ([place]) 02/12/2021   Syncope and collapse 02/12/2021   Effusion, right knee 02/12/2021   COPD (chronic obstructive pulmonary disease) with emphysema ([place]) 02/12/2021   Tobacco abuse 02/12/2021   Alcohol use 02/12/2021   Low back pain 12/14/2020   Primary osteoarthritis of left knee 09/25/2016   S/P total knee replacement using cement, left 09/25/2016   Gout 07/29/2012        Laureen Abrahams, PT, DPT 08/29/21 1:43 PM      [facility] [address] [place], [place], [address] Phone: [phone]   Fax:  [phone]  Name: Edwin Hudson MRN: [id number] Date of Birth: 1956/02/06

## 2021-08-29 NOTE — Progress Notes (Signed)
Office Visit Note   Patient: Edwin Hudson           Date of Birth: 04-16-1956           MRN: [id number] Visit Date: 08/29/2021              Requested by: Reita May, NP [address] [place],  [place] [address] PCP: Reita May, NP   Assessment & Plan: Visit Diagnoses:  1. Unilateral primary osteoarthritis, right knee   2. Total knee replacement status, right     Plan: 2 months status post primary right total knee replacement doing very well.  Passive range of motion 0 to about 115 degrees.  No instability.  Walks without ambulatory aid.  Finishes physical therapy this week and then work on strengthening exercises.  We will plan to see him back in 3 months  Follow-Up Instructions: Return in about 3 months (around 11/28/2021).   Orders:  No orders of the defined types were placed in this encounter.  No orders of the defined types were placed in this encounter.     Procedures: No procedures performed   Clinical Data: No additional findings.   Subjective: Chief Complaint  Patient presents with   Right Knee - Follow-up    Right total knee arthroplasty 07/04/2021  Patient presents today for a one month follow up on his right knee. He had a right total knee arthroplasty on 07/04/2021. He said that he is doing great. He is going to therapy once weekly. No complaints.  HPI  Review of Systems   Objective: Vital Signs: Ht '5\' 9"'$  (1.753 m)   Wt 149 lb (67.6 kg)   BMI 22.00 kg/m   Physical Exam  Ortho Exam right knee incision is healed without a problem.  No effusion.  No instability with varus valgus stress or anterior drawer sign flexed about 115 degrees he lacks just a few degrees to full extension actively but I thought passively I can get him just fully extend extended.  Neurologically intact.  Walks without ambulatory aid  Specialty Comments:  No specialty comments available.  Imaging: No results found.   PMFS History: Patient Active Problem  List   Diagnosis Date Noted   Osteoarthritis of right knee 07/04/2021   Total knee replacement status, right 07/04/2021   Unilateral primary osteoarthritis, right knee 06/01/2021   Acute respiratory failure with hypoxia ([place]) 02/12/2021   Syncope and collapse 02/12/2021   Effusion, right knee 02/12/2021   COPD (chronic obstructive pulmonary disease) with emphysema ([place]) 02/12/2021   Tobacco abuse 02/12/2021   Alcohol use 02/12/2021   Low back pain 12/14/2020   Primary osteoarthritis of left knee 09/25/2016   S/P total knee replacement using cement, left 09/25/2016   Gout 07/29/2012   Past Medical History:  Diagnosis Date   Arthritis    Cancer ([place])    bladder cancer   COPD (chronic obstructive pulmonary disease) ([place])    Gout     History reviewed. No pertinent family history.  Past Surgical History:  Procedure Laterality Date   COLONOSCOPY     KNEE SURGERY Right 1990   teeth removal     TOTAL KNEE ARTHROPLASTY Left 09/25/2016   Procedure: TOTAL KNEE ARTHROPLASTY;  Surgeon: Garald Balding, MD;  Location: [place];  Service: Orthopedics;  Laterality: Left;  block done per Dr. Linna Caprice at [place] Right 07/04/2021   Procedure: RIGHT TOTAL KNEE ARTHROPLASTY;  Surgeon: Garald Balding,  MD;  [facility];  Service: Orthopedics;  Laterality: Right;   Social History   Occupational History   Not on file  Tobacco Use   Smoking status: Every Day    Packs/day: 0.75    Years: 40.00    Pack years: 30.00    Types: Cigarettes   Smokeless tobacco: Never  Vaping Use   Vaping Use: Never used  Substance and Sexual Activity   Alcohol use: Yes    Alcohol/week: 14.0 standard drinks    Types: 14 Standard drinks or equivalent per week    Comment: usually on weekends   Drug use: No   Sexual activity: Not on file

## 2021-09-04 ENCOUNTER — Encounter: Payer: Self-pay | Admitting: Physical Therapy

## 2021-09-04 ENCOUNTER — Other Ambulatory Visit: Payer: Self-pay

## 2021-09-04 ENCOUNTER — Ambulatory Visit (INDEPENDENT_AMBULATORY_CARE_PROVIDER_SITE_OTHER): Payer: Medicare Other | Admitting: Physical Therapy

## 2021-09-04 DIAGNOSIS — R2681 Unsteadiness on feet: Secondary | ICD-10-CM | POA: Diagnosis not present

## 2021-09-04 DIAGNOSIS — M6281 Muscle weakness (generalized): Secondary | ICD-10-CM

## 2021-09-04 DIAGNOSIS — M25661 Stiffness of right knee, not elsewhere classified: Secondary | ICD-10-CM | POA: Diagnosis not present

## 2021-09-04 DIAGNOSIS — M25561 Pain in right knee: Secondary | ICD-10-CM | POA: Diagnosis not present

## 2021-09-04 DIAGNOSIS — R2689 Other abnormalities of gait and mobility: Secondary | ICD-10-CM | POA: Diagnosis not present

## 2021-09-04 DIAGNOSIS — R600 Localized edema: Secondary | ICD-10-CM

## 2021-09-04 NOTE — Patient Instructions (Signed)
Access Code: [email or web address] URL: [email or web address] Date: 09/04/2021 Prepared by: Faustino Congress  Exercises Seated Knee Flexion AAROM - 3-5 x daily - 7 x weekly - 1 sets - 10 reps - 10 sec hold Quad Setting and Stretching - 8-10 x daily - 7 x weekly - 1 sets - 5-10 reps - 5 sec hold Supine Heel Slide with Strap - 8-10 x daily - 7 x weekly - 1 sets - 5-10 reps - 5 sec hold Supine Knee Extension Mobilization with Weight - 3-5 x daily - 7 x weekly - 1 sets - 1 reps - 3-5 min hold Seated Straight Leg Raise - 2 x daily - 7 x weekly - 2 sets - 10 reps - 5 sec hold Step Up - 2 x daily - 7 x weekly - 2 sets - 10 reps Kettlebell Squat - 1 x daily - 7 x weekly - 2 sets - 10 reps Single Leg Deadlift with Kettlebell - 1 x daily - 7 x weekly - 2 sets - 10 reps

## 2021-09-04 NOTE — Therapy (Signed)
[facility] [address] [place], [place], [address] Phone: [phone]   Fax:  [phone]  Physical Therapy Treatment  Patient Details  Name: Edwin Hudson MRN: [id number] Date of Birth: 03-Jan-1956 Referring Provider (PT): Garald Balding, MD   Encounter Date: 09/04/2021   PT End of Session - 09/04/21 1425     Visit Number 5    Number of Visits 8    Date for PT Re-Evaluation 09/27/21    Authorization Type UHC Medicare $30 copay    PT Start Time 1342    PT Stop Time 1425    PT Time Calculation (min) 43 min    Activity Tolerance Patient tolerated treatment well    Behavior During Therapy [facility] for tasks assessed/performed             Past Medical History:  Diagnosis Date   Arthritis    Cancer ([place])    bladder cancer   COPD (chronic obstructive pulmonary disease) ([place])    Gout     Past Surgical History:  Procedure Laterality Date   COLONOSCOPY     KNEE SURGERY Right 1990   teeth removal     TOTAL KNEE ARTHROPLASTY Left 09/25/2016   Procedure: TOTAL KNEE ARTHROPLASTY;  Surgeon: Garald Balding, MD;  Location: [place];  Service: Orthopedics;  Laterality: Left;  block done per Dr. Linna Caprice at [place] Right 07/04/2021   Procedure: RIGHT TOTAL KNEE ARTHROPLASTY;  Surgeon: Garald Balding, MD;  [facility];  Service: Orthopedics;  Laterality: Right;    There were no vitals filed for this visit.   Subjective Assessment - 09/04/21 1346     Subjective stood up from lower chair without arms and has had pain in medial Rt knee since.  reports it was sharp pain initially but is improving.    Pertinent History gout, oa, copd, bladder ca    Patient Stated Goals improve function, return to regular function    Currently in Pain? Yes    Pain Score 3     Pain Location Knee    Pain Orientation Right    Pain Descriptors / Indicators Aching    Pain Type Acute pain;Surgical pain    Pain Onset More than a month  ago    Pain Frequency Constant    Aggravating Factors  morning, nighttime, bending    Pain Relieving Factors repositioning, ice                               [facility] Adult PT Treatment/Exercise - 09/04/21 1349       Knee/Hip Exercises: Stretches   Passive Hamstring Stretch Right;3 reps;30 seconds    Passive Hamstring Stretch Limitations seated with overpressure at distal thigh      Knee/Hip Exercises: Aerobic   Recumbent Bike L4 x 8 min      Knee/Hip Exercises: Machines for Strengthening   Cybex Leg Press bil push 3x10 100#; RLE only 50# 3x10      Knee/Hip Exercises: Standing   Forward Step Up Right;2 sets;10 reps;Hand Hold: 1;Step Height: 6"    Functional Squat 2 sets;10 reps;3 seconds    Functional Squat Limitations 10# KB    Other Standing Knee Exercises RLE DL 10# KB 2x10, RUE support      Knee/Hip Exercises: Seated   Other Seated Knee/Hip Exercises seated SLR 2x10; 5 sec hold; Rt    Sit to Sand 20  reps;without UE support                     PT Education - 09/04/21 1425     Education Details HEP progressed    Person(s) Educated Patient    Methods Explanation;Demonstration;Handout    Comprehension Verbalized understanding;Returned demonstration;Need further instruction              PT Short Term Goals - 08/29/21 1340       PT SHORT TERM GOAL #1   Title Independent with initial HEP    Time 4    Period Weeks    Status Achieved    Target Date 08/30/21      PT SHORT TERM GOAL #2   Title Rt knee AROM improved 0-100 for improved function    Baseline 9/6: 0-2-113    Time 4    Period Weeks    Status Partially Met    Target Date 08/30/21               PT Long Term Goals - 08/29/21 1340       PT LONG TERM GOAL #1   Title Independent with final HEP    Time 8    Period Weeks    Status On-going    Target Date 09/27/21      PT LONG TERM GOAL #2   Title Rt knee AROM improved 0-110 for improved function    Time 8     Period Weeks    Status On-going      PT LONG TERM GOAL #3   Title Amb independently without significant deviations for improved function    Time 8    Period Weeks    Status On-going      PT LONG TERM GOAL #4   Title Report pain < 2/10 for improved function    Time 8    Period Weeks    Status On-going      PT LONG TERM GOAL #5   Title FOTO score improved to 64 for improved function    Time 8    Period Weeks    Status On-going                   Plan - 09/04/21 1425     Clinical Impression Statement Pt tolerated session well today which focused on updating HEP in preparation for d/c next week.  Will plan to d/c next week.    Personal Factors and Comorbidities Comorbidity 3+;Finances    Comorbidities gout, oa, copd, bladder ca    Examination-Activity Limitations Squat;Stairs;Stand;Locomotion Level    Examination-Participation Restrictions Cleaning;Community Activity;Driving;Yard Work    PT Frequency 1x / week    PT Duration 8 weeks    PT Treatment/Interventions ADLs/Self Care Home Management;Cryotherapy;Electrical Stimulation;Moist Heat;Balance training;Therapeutic exercise;Therapeutic activities;Functional mobility training;Stair training;Gait training;Neuromuscular re-education;Patient/family education;Manual techniques;Vasopneumatic Device;Taping;Dry needling;Passive range of motion    PT Next Visit Plan review HEP, plan for d/c, check goals    PT Home Exercise Plan Access Code: [email or web address]    Consulted and Agree with Plan of Care Patient             Patient will benefit from skilled therapeutic intervention in order to improve the following deficits and impairments:  Abnormal gait, Pain, Decreased strength, Difficulty walking, Decreased mobility, Decreased balance, Decreased range of motion, Postural dysfunction, Increased fascial restricitons, Increased muscle spasms, Increased edema  Visit Diagnosis: Acute pain of right knee  Stiffness of right knee, not  elsewhere classified  Other abnormalities of gait and mobility  Unsteadiness on feet  Muscle weakness (generalized)  Localized edema     Problem List Patient Active Problem List   Diagnosis Date Noted   Osteoarthritis of right knee 07/04/2021   Total knee replacement status, right 07/04/2021   Unilateral primary osteoarthritis, right knee 06/01/2021   Acute respiratory failure with hypoxia ([place]) 02/12/2021   Syncope and collapse 02/12/2021   Effusion, right knee 02/12/2021   COPD (chronic obstructive pulmonary disease) with emphysema ([place]) 02/12/2021   Tobacco abuse 02/12/2021   Alcohol use 02/12/2021   Low back pain 12/14/2020   Primary osteoarthritis of left knee 09/25/2016   S/P total knee replacement using cement, left 09/25/2016   Gout 07/29/2012      Laureen Abrahams, PT, DPT 09/04/21 2:27 PM     [facility] [address] [place], [place], [address] Phone: [phone]   Fax:  [phone]  Name: DEANTE BLOUGH MRN: [id number] Date of Birth: October 19, 1956

## 2021-09-11 ENCOUNTER — Other Ambulatory Visit: Payer: Self-pay

## 2021-09-11 ENCOUNTER — Ambulatory Visit (INDEPENDENT_AMBULATORY_CARE_PROVIDER_SITE_OTHER): Payer: Medicare Other | Admitting: Physical Therapy

## 2021-09-11 ENCOUNTER — Encounter: Payer: Self-pay | Admitting: Physical Therapy

## 2021-09-11 DIAGNOSIS — M25661 Stiffness of right knee, not elsewhere classified: Secondary | ICD-10-CM

## 2021-09-11 DIAGNOSIS — R2689 Other abnormalities of gait and mobility: Secondary | ICD-10-CM

## 2021-09-11 DIAGNOSIS — R600 Localized edema: Secondary | ICD-10-CM

## 2021-09-11 DIAGNOSIS — M25561 Pain in right knee: Secondary | ICD-10-CM

## 2021-09-11 DIAGNOSIS — M6281 Muscle weakness (generalized): Secondary | ICD-10-CM

## 2021-09-11 DIAGNOSIS — R2681 Unsteadiness on feet: Secondary | ICD-10-CM | POA: Diagnosis not present

## 2021-09-11 NOTE — Therapy (Signed)
[facility] [address] [place], [place], [address] Phone: [phone]   Fax:  [phone]  Physical Therapy Treatment/Discharge Summary  Patient Details  Name: Edwin Hudson MRN: [id number] Date of Birth: 1956/10/17 Referring Provider (PT): Garald Balding, MD   Encounter Date: 09/11/2021   PT End of Session - 09/11/21 1425     Visit Number 6    Number of Visits 8    Date for PT Re-Evaluation 09/27/21    Authorization Type [employer] Medicare $30 copay    PT Start Time 1344    PT Stop Time 1425    PT Time Calculation (min) 41 min    Activity Tolerance Patient tolerated treatment well    Behavior During Therapy [facility] for tasks assessed/performed             Past Medical History:  Diagnosis Date   Arthritis    Cancer ([place])    bladder cancer   COPD (chronic obstructive pulmonary disease) ([place])    Gout     Past Surgical History:  Procedure Laterality Date   COLONOSCOPY     KNEE SURGERY Right 1990   teeth removal     TOTAL KNEE ARTHROPLASTY Left 09/25/2016   Procedure: TOTAL KNEE ARTHROPLASTY;  Surgeon: Garald Balding, MD;  Location: [facility];  Service: Orthopedics;  Laterality: Left;  block done per Dr. Linna Caprice at [facility] Right 07/04/2021   Procedure: RIGHT TOTAL KNEE ARTHROPLASTY;  Surgeon: Garald Balding, MD;  Location: [facility];  Service: Orthopedics;  Laterality: Right;    There were no vitals filed for this visit.   Subjective Assessment - 09/11/21 1345     Subjective had a fall on Friday when he slipped on a wet mat outdoors, felt his knee go "way under me at an angle" and reports knee is very stiff.  "I don't think I did anything to it though."    Pertinent History gout, oa, copd, bladder ca    Patient Stated Goals improve function, return to regular function    Currently in Pain? Yes    Pain Score 4     Pain Location Knee    Pain Orientation Right    Pain Descriptors / Indicators Aching     Pain Type Acute pain;Surgical pain    Pain Onset More than a month ago    Pain Frequency Constant    Aggravating Factors  mornng, nighttime, bending    Pain Relieving Factors repositioning, ice                [facility] PT Assessment - 09/11/21 1359       Assessment   Medical Diagnosis Z96.651 (ICD-10-CM) - Total knee replacement status, right    Referring Provider (PT) Garald Balding, MD    Onset Date/Surgical Date 07/04/21      Observation/Other Assessments   Focus on Therapeutic Outcomes (FOTO)  66      AROM   Right Knee Extension 0    Right Knee Flexion 114      Strength   Strength Assessment Site Knee    Right/Left Knee Right                           [facility] Adult PT Treatment/Exercise - 09/11/21 1346       Knee/Hip Exercises: Stretches   Passive Hamstring Stretch Right;3 reps;30 seconds    Passive Hamstring Stretch Limitations seated with overpressure at  distal thigh      Knee/Hip Exercises: Aerobic   Recumbent Bike L4 x 8 min      Knee/Hip Exercises: Machines for Strengthening   Cybex Leg Press bil push 3x10 106#; RLE only 56# x10, 50# 2x10      Knee/Hip Exercises: Standing   SLS 5x10 sec RLE on foam      Knee/Hip Exercises: Seated   Long Arc Quad Right;3 sets;10 reps    Long Arc Quad Weight 5 lbs.      Knee/Hip Exercises: Supine   Heel Slides AAROM;Right;20 reps      Knee/Hip Exercises: Prone   Straight Leg Raises Right;20 reps    Straight Leg Raises Limitations 3#    Other Prone Exercises prone quad set with 3# on Rt x 20 reps                       PT Short Term Goals - 09/11/21 1425       PT SHORT TERM GOAL #1   Title Independent with initial HEP    Time 4    Period Weeks    Status Achieved    Target Date 08/30/21      PT SHORT TERM GOAL #2   Title Rt knee AROM improved 0-100 for improved function    Time 4    Period Weeks    Status Achieved    Target Date 08/30/21               PT Long Term  Goals - 09/11/21 1425       PT LONG TERM GOAL #1   Title Independent with final HEP    Time 8    Period Weeks    Status Achieved      PT LONG TERM GOAL #2   Title Rt knee AROM improved 0-110 for improved function    Time 8    Period Weeks    Status Achieved      PT LONG TERM GOAL #3   Title Amb independently without significant deviations for improved function    Baseline 9/19: was met until recent fall - anticipate this will improve    Time 8    Period Weeks    Status Partially Met      PT LONG TERM GOAL #4   Title Report pain < 2/10 for improved function    Time 8    Period Weeks    Status Partially Met      PT LONG TERM GOAL #5   Title FOTO score improved to 64 for improved function    Time 8    Period Weeks    Status Achieved                   Plan - 09/11/21 1426     Clinical Impression Statement Pt has met/partially met all goals and is ready for d/c as he is pleased with his progress.  He reports a recent fall that has resulted in some mildly increased pain but session today improved pain, and feel overall this will improve with time.  Will d/c PT today.  He knows to call MD if pain isn't improved this week.    Personal Factors and Comorbidities Comorbidity 3+;Finances    Comorbidities gout, oa, copd, bladder ca    Examination-Activity Limitations Squat;Stairs;Stand;Locomotion Level    Examination-Participation Restrictions Cleaning;Community Activity;Driving;Yard Work    PT Frequency 1x / week    PT Duration 8  weeks    PT Treatment/Interventions ADLs/Self Care Home Management;Cryotherapy;Electrical Stimulation;Moist Heat;Balance training;Therapeutic exercise;Therapeutic activities;Functional mobility training;Stair training;Gait training;Neuromuscular re-education;Patient/family education;Manual techniques;Vasopneumatic Device;Taping;Dry needling;Passive range of motion    PT Next Visit Plan d/c PT today    PT Home Exercise Plan Access Code: [email or web address]     Consulted and Agree with Plan of Care Patient             Patient will benefit from skilled therapeutic intervention in order to improve the following deficits and impairments:  Abnormal gait, Pain, Decreased strength, Difficulty walking, Decreased mobility, Decreased balance, Decreased range of motion, Postural dysfunction, Increased fascial restricitons, Increased muscle spasms, Increased edema  Visit Diagnosis: Acute pain of right knee  Stiffness of right knee, not elsewhere classified  Other abnormalities of gait and mobility  Unsteadiness on feet  Muscle weakness (generalized)  Localized edema     Problem List Patient Active Problem List   Diagnosis Date Noted   Osteoarthritis of right knee 07/04/2021   Total knee replacement status, right 07/04/2021   Unilateral primary osteoarthritis, right knee 06/01/2021   Acute respiratory failure with hypoxia ([place]) 02/12/2021   Syncope and collapse 02/12/2021   Effusion, right knee 02/12/2021   COPD (chronic obstructive pulmonary disease) with emphysema ([place]) 02/12/2021   Tobacco abuse 02/12/2021   Alcohol use 02/12/2021   Low back pain 12/14/2020   Primary osteoarthritis of left knee 09/25/2016   S/P total knee replacement using cement, left 09/25/2016   Gout 07/29/2012     Laureen Abrahams, PT, DPT 09/11/21 2:28 PM   [facility] [address] [place], [place], [address] Phone: [phone]   Fax:  [phone]  Name: Edwin Hudson MRN: [id number] Date of Birth: 01-21-1956  PHYSICAL THERAPY DISCHARGE SUMMARY  Visits from Start of Care: 6  Current functional level related to goals / functional outcomes: See above   Remaining deficits: See above   Education / Equipment: HEP   Patient agrees to discharge. Patient goals were partially met. Patient is being discharged due to being pleased with the current functional level.   Laureen Abrahams, PT,  DPT 09/11/21 2:28 PM  [facility] [address] [place], [place], [address] Phone: [phone]   Fax:  [phone]

## 2021-11-28 ENCOUNTER — Ambulatory Visit: Payer: Medicare Other | Admitting: Orthopaedic Surgery

## 2022-01-17 IMAGING — CR DG KNEE COMPLETE 4+V*R*
4 series · 4 of 4 positions shown · non-contrast
Comparison: None.

CLINICAL DATA: Status post trauma.

EXAM:
RIGHT KNEE - COMPLETE 4+ VIEW

[knee ap]
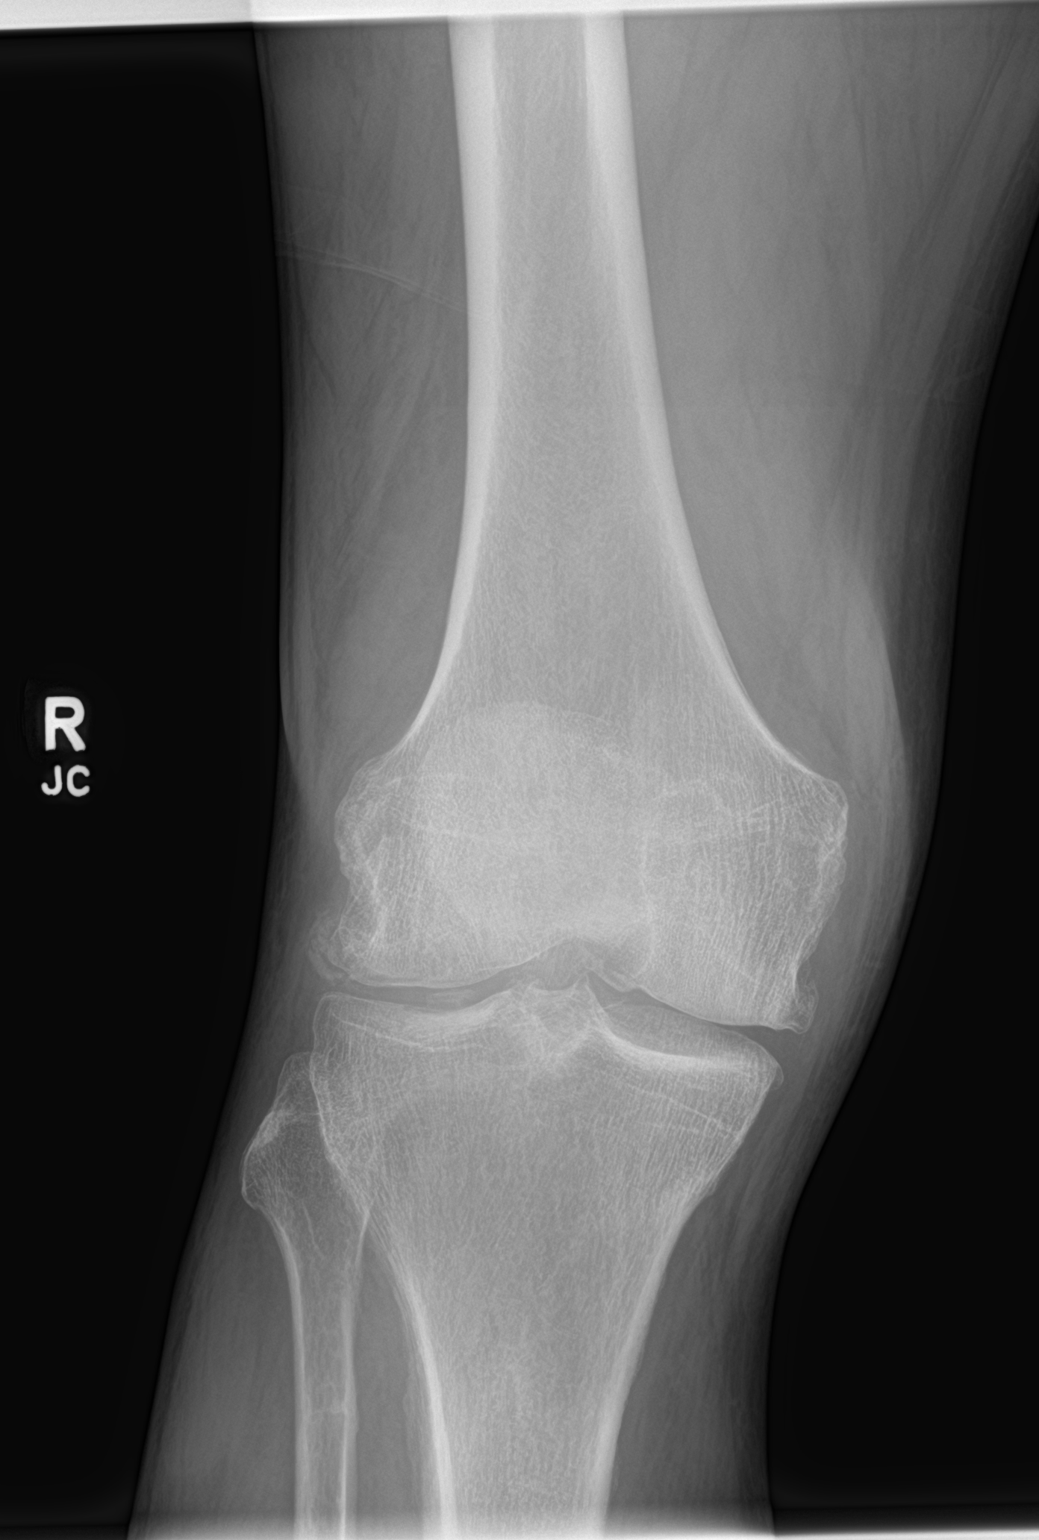

[knee lat]
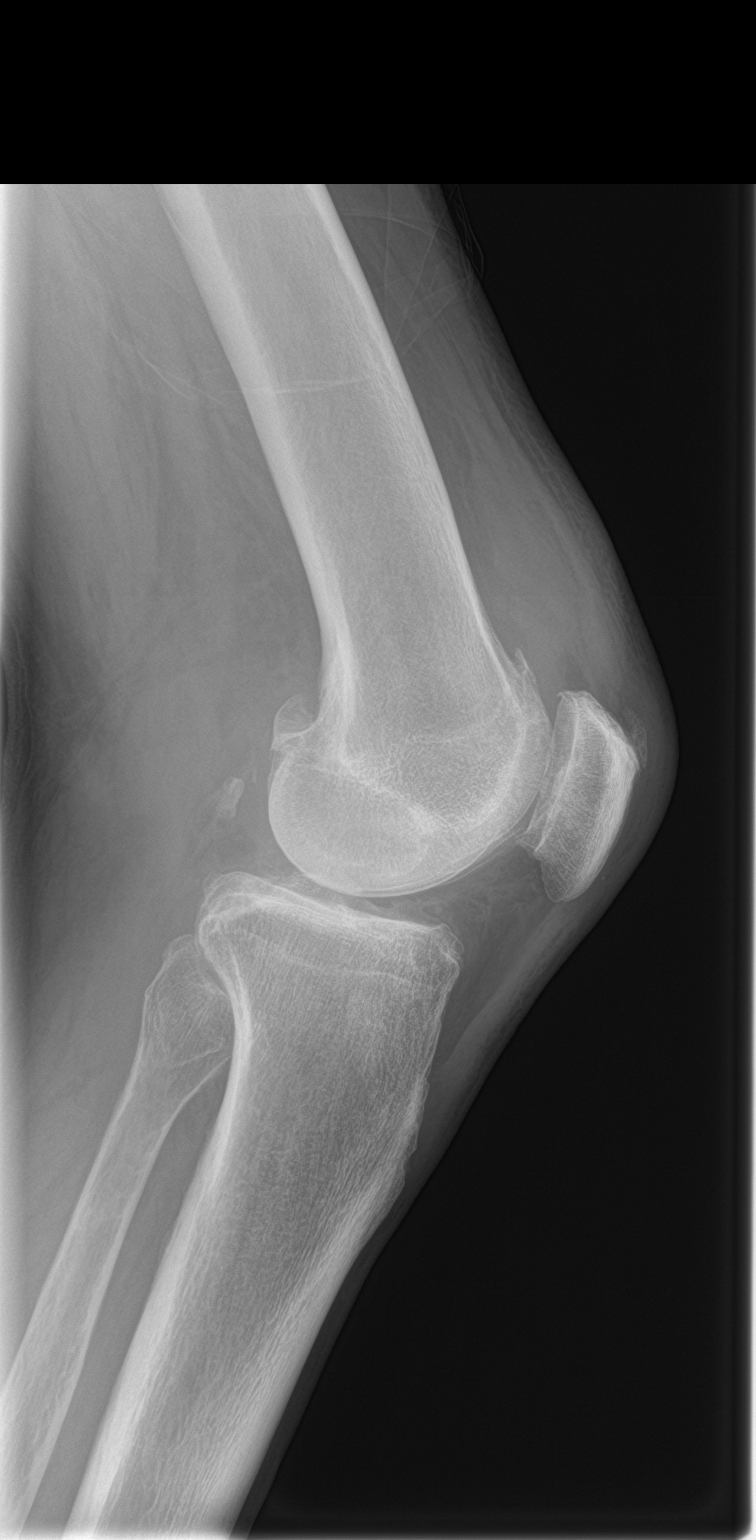

[knee obl (1 of 2)]
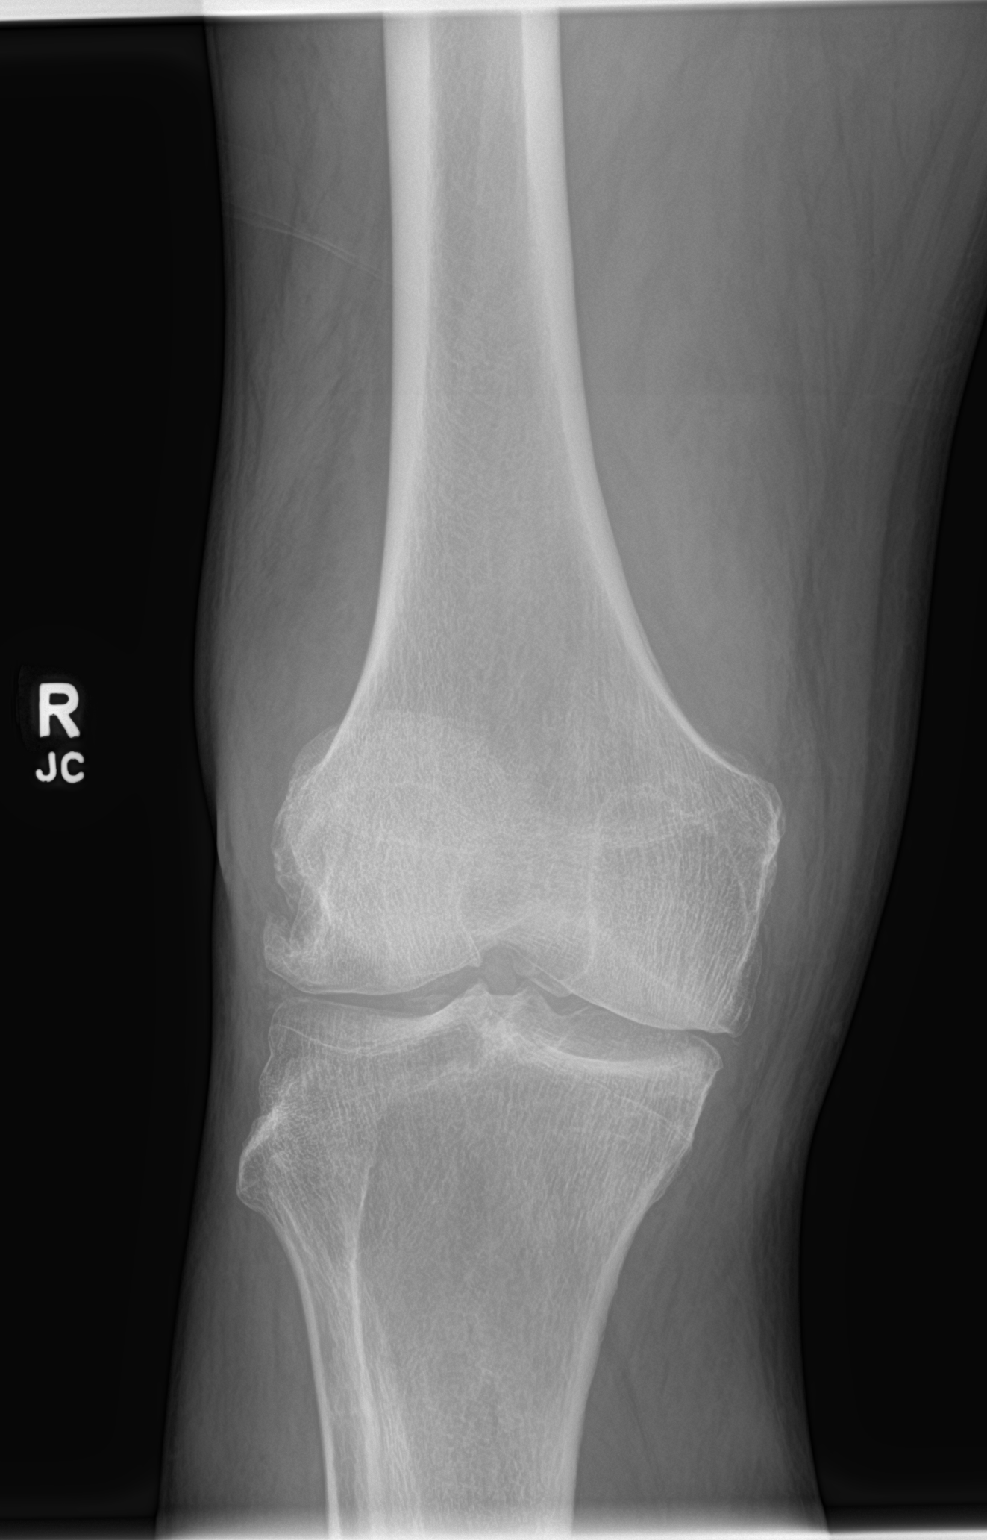

[knee obl (2 of 2)]
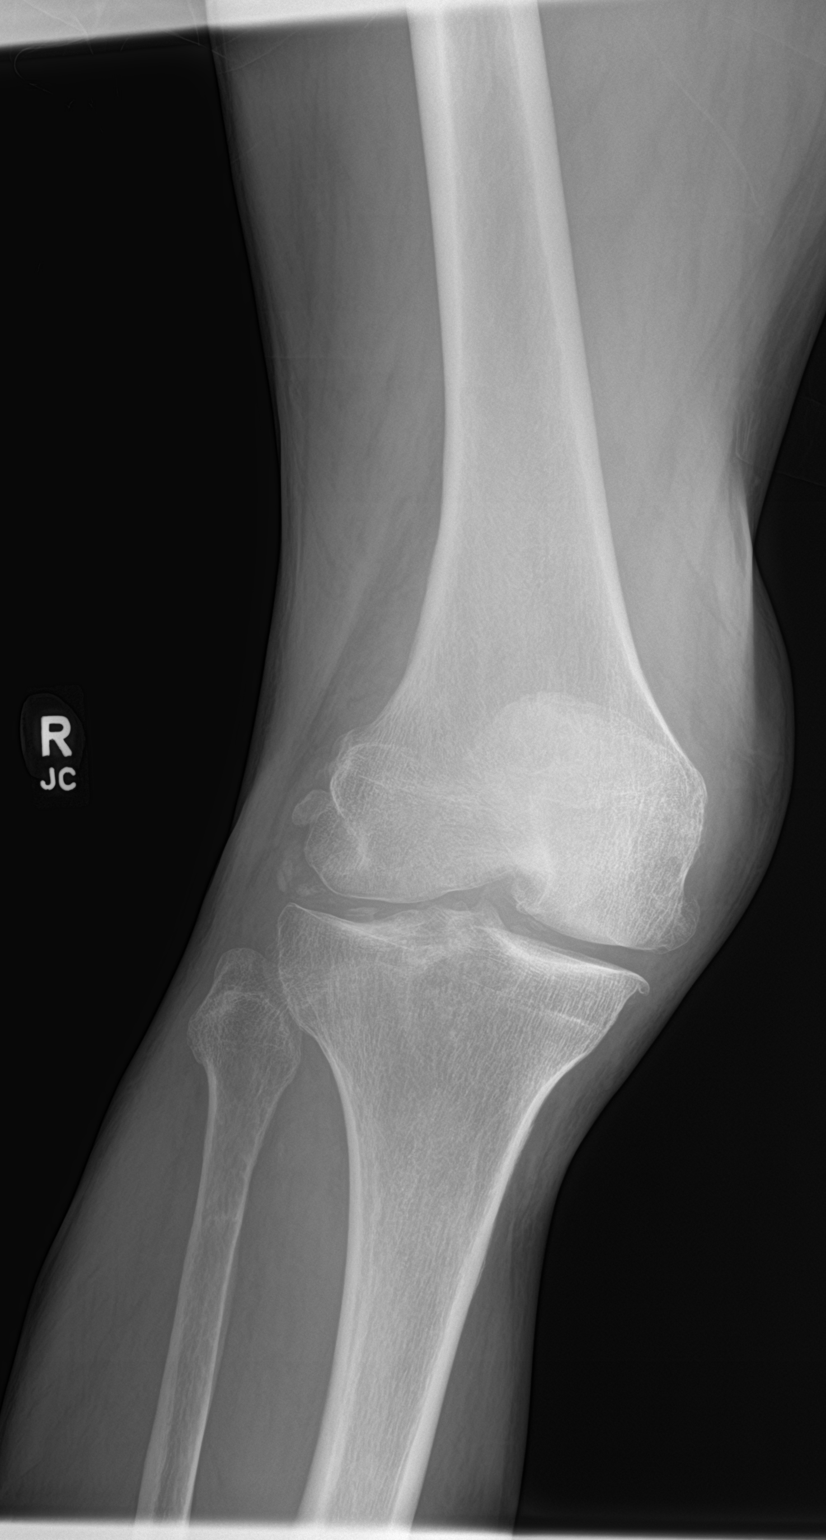

[4 of 4 positions shown; findings below may reference images not displayed]

FINDINGS: No evidence of acute fracture or dislocation. Mild medial and
lateral tibiofemoral compartment space narrowing is seen with
degenerative changes also noted along the distal aspect of the right
femur. A moderate to large joint effusion is seen.
IMPRESSION: 1. Moderate to large joint effusion without evidence of acute
fracture.

## 2022-01-17 IMAGING — CT CT ANGIO CHEST
2 of 6 series · 18 of 46 positions shown · IV contrast (omnipaque)
Comparison: None.

CLINICAL DATA: Status post fall.

EXAM:
CT ANGIOGRAPHY CHEST WITH CONTRAST
TECHNIQUE: Multidetector CT imaging of the chest was performed using the
standard protocol during bolus administration of intravenous
contrast. Multiplanar CT image reconstructions and MIPs were
obtained to evaluate the vascular anatomy.
CONTRAST:  80mL OMNIPAQUE IOHEXOL 350 MG/ML SOLN

[Series 6: thins · axial · 0.75mm/px · z∈[+1240,+1500]mm · 15 of 286 slices shown]
[im 13/286  lung]
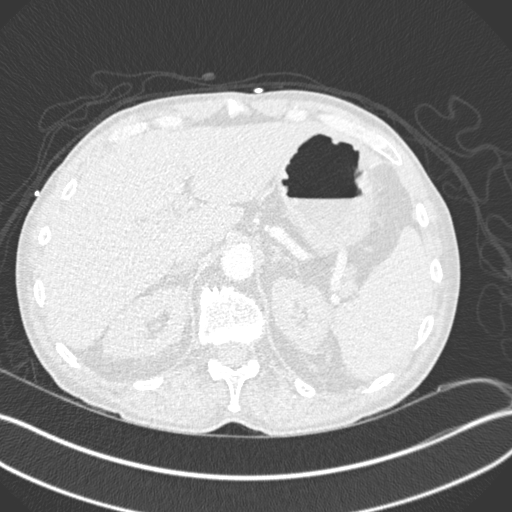
[im 38/286  soft-tissue]
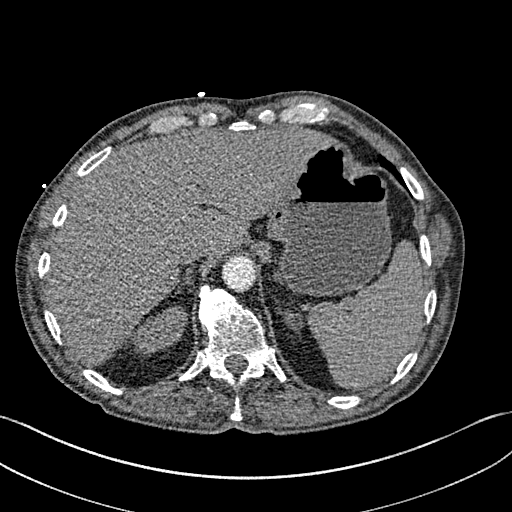
[im 50/286  lung]
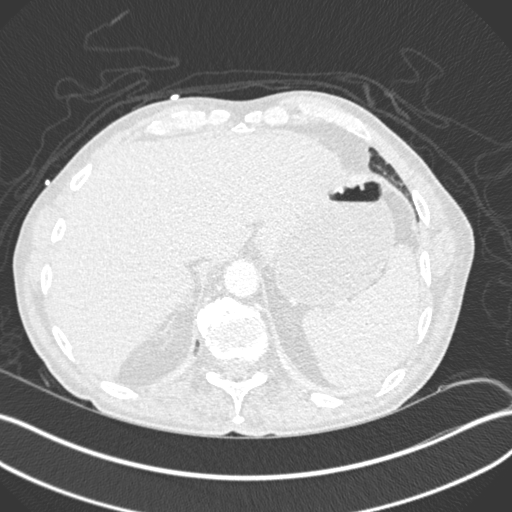
[im 75/286  soft-tissue]
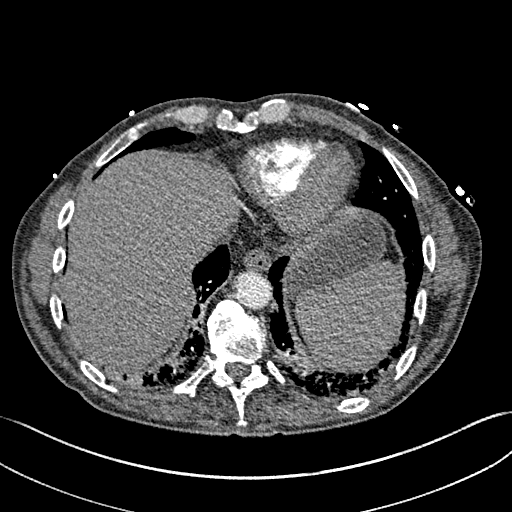
[im 87/286  lung]
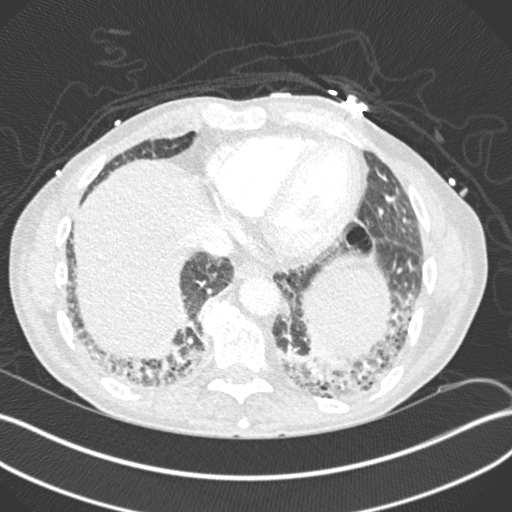
[im 112/286  soft-tissue]
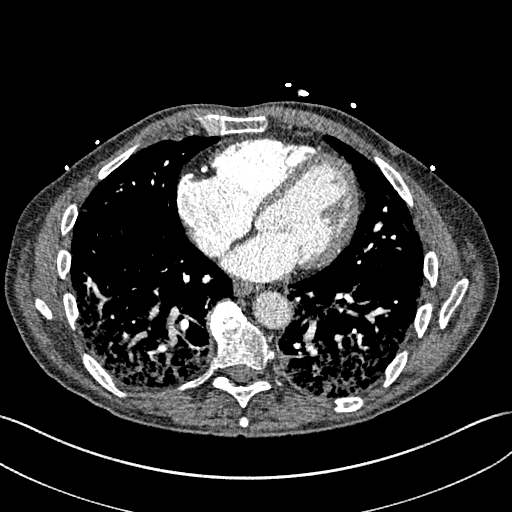
[im 124/286  lung]
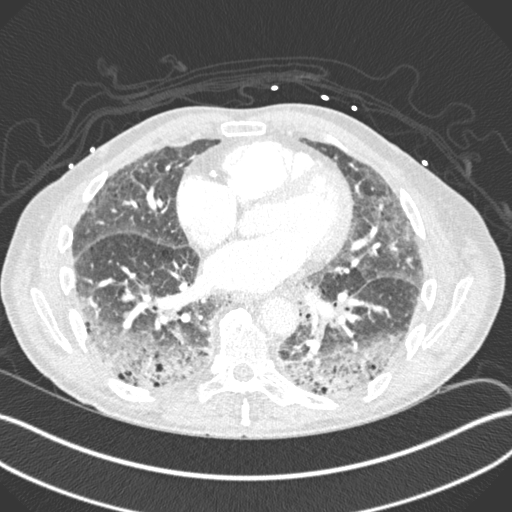
[im 149/286  soft-tissue]
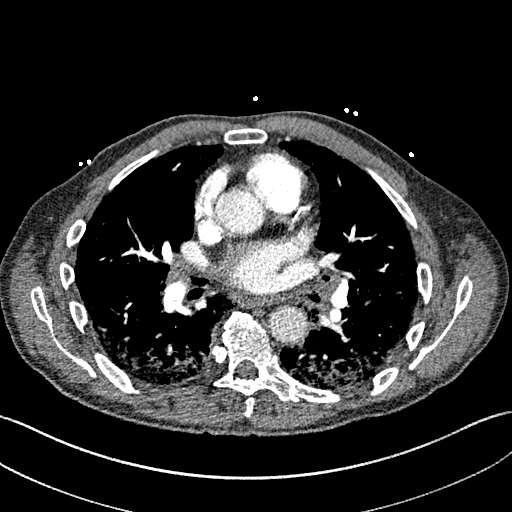
[im 162/286  lung]
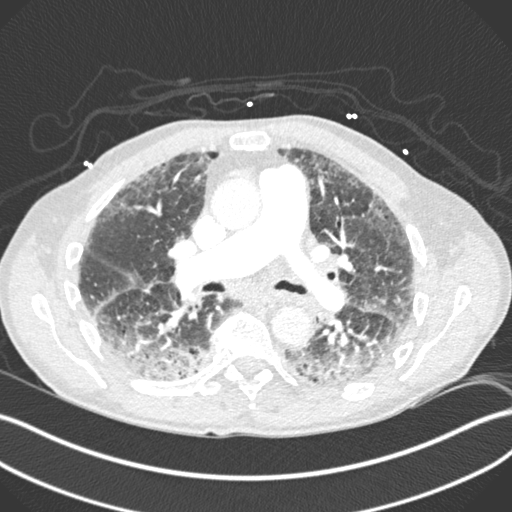
[im 174/286  soft-tissue]
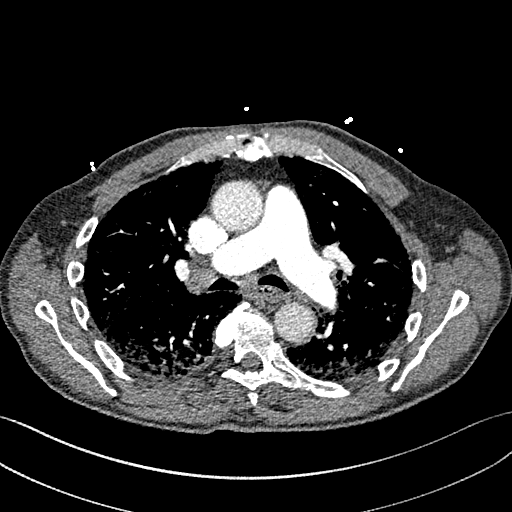
[im 199/286  lung]
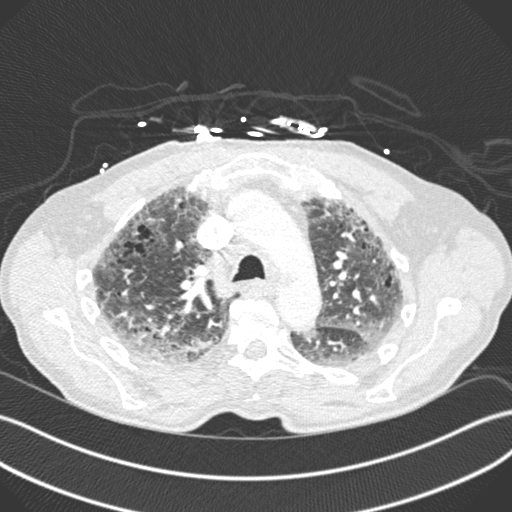
[im 211/286  soft-tissue]
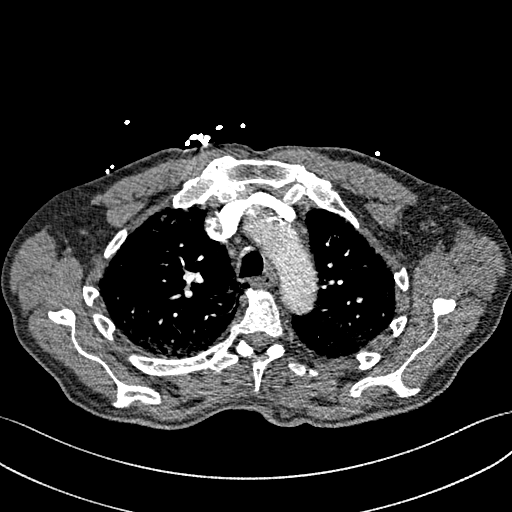
[im 236/286  lung]
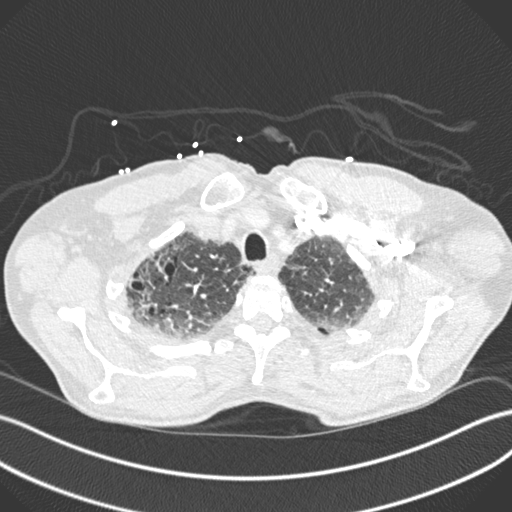
[im 248/286  soft-tissue]
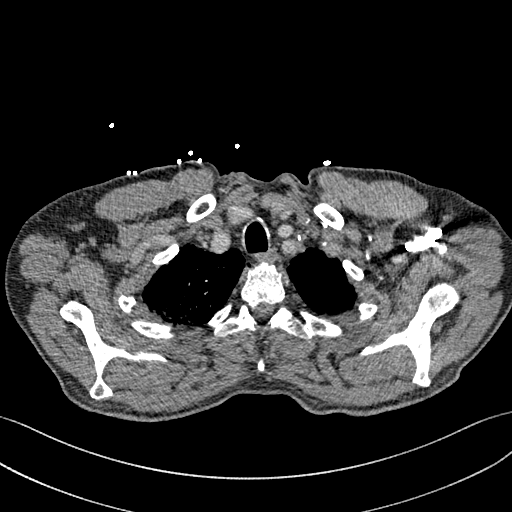
[im 273/286  lung]
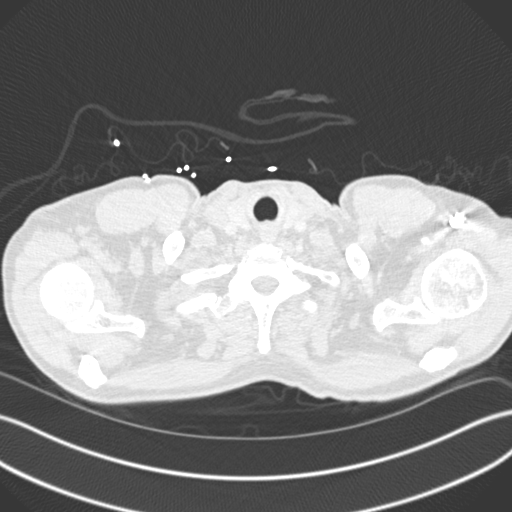

[Series 8: coronal mpr · coronal · 0.59mm/px · 3 of 137 slices shown]
[im 35/137  soft-tissue]
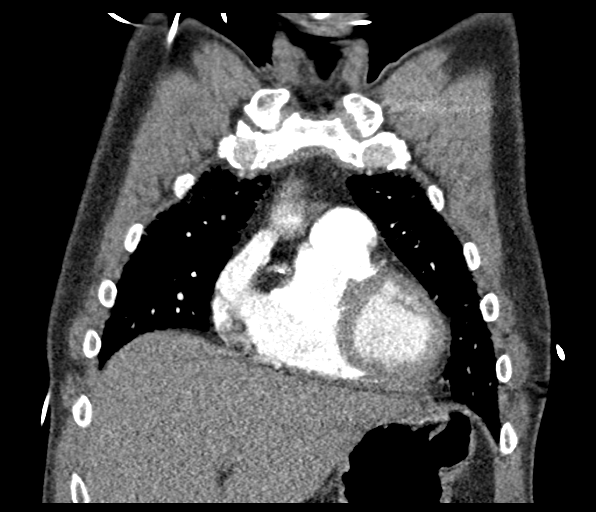
[im 69/137  soft-tissue]
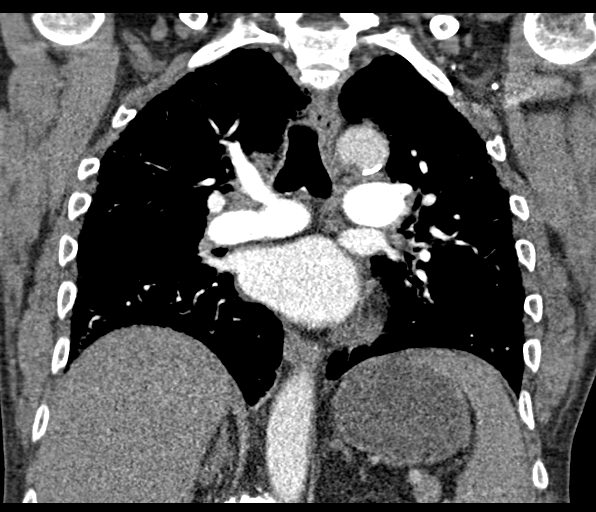
[im 103/137  soft-tissue]
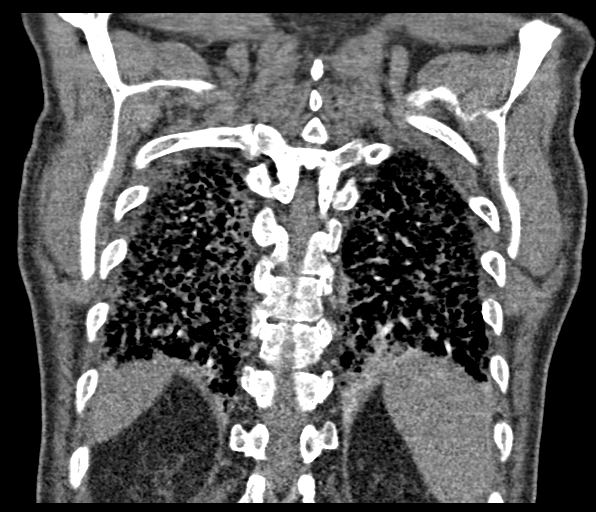

[18 of 46 positions shown; findings below may reference images not displayed]

FINDINGS: Cardiovascular: There is mild calcification and atherosclerosis of
the aortic arch. Satisfactory opacification of the pulmonary
arteries to the segmental level. No evidence of pulmonary embolism.
Normal heart size with mild coronary artery calcification. No
pericardial effusion.

Mediastinum/Nodes: No enlarged mediastinal, hilar, or axillary lymph
nodes. Thyroid gland, trachea, and esophagus demonstrate no
significant findings.

Lungs/Pleura: Mild emphysematous lung disease is seen involving the
bilateral upper lobes, right greater than left.

Moderate severity fibrotic changes and honeycombing he is also noted
within the posterior aspects of the bilateral lower lobes.

Mild atelectasis is also seen within the bilateral lung bases.

There is no evidence of a pleural effusion or pneumothorax.

Upper Abdomen: No acute abnormality.

Musculoskeletal: Multilevel degenerative changes seen throughout the
thoracic spine.

Review of the MIP images confirms the above findings.
IMPRESSION: 1. No evidence of pulmonary embolism.
2. Moderate severity bilateral lower lobe fibrotic changes with mild
bibasilar atelectasis.
3. Emphysema and aortic atherosclerosis.

Aortic Atherosclerosis ([id number]-KZK.K) and Emphysema ([id number].L).
